# Patient Record
Sex: Male | Born: 1955
Health system: Southern US, Community
[De-identification: ages and names within clinical notes are randomized; demographics above are authoritative.]

## PROBLEM LIST (undated history)

## (undated) DIAGNOSIS — A4902 Methicillin resistant Staphylococcus aureus infection, unspecified site: Secondary | ICD-10-CM

## (undated) DIAGNOSIS — J449 Chronic obstructive pulmonary disease, unspecified: Secondary | ICD-10-CM

## (undated) DIAGNOSIS — R7303 Prediabetes: Secondary | ICD-10-CM

## (undated) DIAGNOSIS — K219 Gastro-esophageal reflux disease without esophagitis: Secondary | ICD-10-CM

## (undated) DIAGNOSIS — M792 Neuralgia and neuritis, unspecified: Secondary | ICD-10-CM

## (undated) DIAGNOSIS — F329 Major depressive disorder, single episode, unspecified: Secondary | ICD-10-CM

## (undated) DIAGNOSIS — G8929 Other chronic pain: Secondary | ICD-10-CM

## (undated) DIAGNOSIS — M1990 Unspecified osteoarthritis, unspecified site: Secondary | ICD-10-CM

## (undated) DIAGNOSIS — M549 Dorsalgia, unspecified: Secondary | ICD-10-CM

## (undated) DIAGNOSIS — M1711 Unilateral primary osteoarthritis, right knee: Secondary | ICD-10-CM

## (undated) DIAGNOSIS — F32A Depression, unspecified: Secondary | ICD-10-CM

## (undated) DIAGNOSIS — I10 Essential (primary) hypertension: Secondary | ICD-10-CM

## (undated) HISTORY — PX: NOSE SURGERY: SHX723

## (undated) HISTORY — PX: ELBOW SURGERY: SHX618

---

## 2003-08-20 ENCOUNTER — Encounter: Admission: RE | Admit: 2003-08-20 | Discharge: 2003-08-20 | Payer: Self-pay | Admitting: Infectious Diseases

## 2003-09-16 ENCOUNTER — Encounter: Admission: RE | Admit: 2003-09-16 | Discharge: 2003-09-16 | Payer: Self-pay | Admitting: Infectious Diseases

## 2007-06-19 ENCOUNTER — Ambulatory Visit (HOSPITAL_COMMUNITY): Admission: RE | Admit: 2007-06-19 | Discharge: 2007-06-19 | Payer: Self-pay | Admitting: Anesthesiology

## 2007-08-16 ENCOUNTER — Emergency Department (HOSPITAL_COMMUNITY): Admission: EM | Admit: 2007-08-16 | Discharge: 2007-08-16 | Payer: Self-pay | Admitting: Emergency Medicine

## 2009-04-24 ENCOUNTER — Ambulatory Visit: Payer: Self-pay | Admitting: Cardiology

## 2010-03-31 ENCOUNTER — Emergency Department (HOSPITAL_COMMUNITY)
Admission: EM | Admit: 2010-03-31 | Discharge: 2010-03-31 | Payer: Self-pay | Source: Home / Self Care | Admitting: Emergency Medicine

## 2010-06-19 ENCOUNTER — Inpatient Hospital Stay (HOSPITAL_COMMUNITY)
Admission: EM | Admit: 2010-06-19 | Discharge: 2010-06-22 | DRG: 896 | Disposition: A | Discharge status: Home / Self Care | Payer: Medicaid Other | Attending: Emergency Medicine | Admitting: Emergency Medicine

## 2010-06-19 ENCOUNTER — Emergency Department (HOSPITAL_COMMUNITY): Payer: Medicaid Other

## 2010-06-19 DIAGNOSIS — E860 Dehydration: Secondary | ICD-10-CM | POA: Diagnosis present

## 2010-06-19 DIAGNOSIS — F10931 Alcohol use, unspecified with withdrawal delirium: Principal | ICD-10-CM | POA: Diagnosis present

## 2010-06-19 DIAGNOSIS — F1020 Alcohol dependence, uncomplicated: Secondary | ICD-10-CM | POA: Diagnosis present

## 2010-06-19 DIAGNOSIS — F10231 Alcohol dependence with withdrawal delirium: Principal | ICD-10-CM | POA: Diagnosis present

## 2010-06-19 DIAGNOSIS — M549 Dorsalgia, unspecified: Secondary | ICD-10-CM | POA: Diagnosis present

## 2010-06-19 DIAGNOSIS — J189 Pneumonia, unspecified organism: Secondary | ICD-10-CM | POA: Diagnosis present

## 2010-06-19 DIAGNOSIS — G8929 Other chronic pain: Secondary | ICD-10-CM | POA: Diagnosis present

## 2010-06-19 DIAGNOSIS — F341 Dysthymic disorder: Secondary | ICD-10-CM | POA: Diagnosis present

## 2010-06-19 LAB — BASIC METABOLIC PANEL
BUN: 20 mg/dL (ref 6–23)
CO2: 24 mEq/L (ref 19–32)
Chloride: 100 mEq/L (ref 96–112)
Creatinine, Ser: 0.92 mg/dL (ref 0.4–1.5)
GFR calc Af Amer: >60 mL/min (ref >60)
Potassium: 3.7 mEq/L (ref 3.5–5.1)

## 2010-06-19 LAB — DIFFERENTIAL
Lymphs Abs: 1.4 10*3/uL (ref 0.7–4.0)
Monocytes Relative: 9 % (ref 3–12)
Neutro Abs: 7.4 10*3/uL (ref 1.7–7.7)
Neutrophils Relative %: 76 % (ref 43–77)

## 2010-06-19 LAB — CBC
Hemoglobin: 13.7 g/dL (ref 13.0–17.0)
MCH: 33.2 pg (ref 26.0–34.0)
MCV: 100.7 fL — ABNORMAL HIGH (ref 78.0–100.0)
RBC: 4.13 MIL/uL — ABNORMAL LOW (ref 4.22–5.81)
WBC: 9.8 10*3/uL (ref 4.0–10.5)

## 2010-06-20 ENCOUNTER — Emergency Department (HOSPITAL_COMMUNITY): Payer: Medicaid Other

## 2010-06-20 LAB — HEPATIC FUNCTION PANEL
ALT: 34 U/L (ref 0–53)
AST: 28 U/L (ref 0–37)
Albumin: 3.2 g/dL — ABNORMAL LOW (ref 3.5–5.2)
Alkaline Phosphatase: 44 U/L (ref 39–117)
Bilirubin, Direct: 0.1 mg/dL (ref 0.0–0.3)
Indirect Bilirubin: 0.6 mg/dL (ref 0.3–0.9)
Total Bilirubin: 0.7 mg/dL (ref 0.3–1.2)
Total Protein: 6.1 g/dL (ref 6.0–8.3)

## 2010-06-20 LAB — AMMONIA: Ammonia: 33 umol/L (ref 11–35)

## 2010-06-20 LAB — URINALYSIS, ROUTINE W REFLEX MICROSCOPIC
Glucose, UA: NEGATIVE mg/dL
Hgb urine dipstick: NEGATIVE
Nitrite: NEGATIVE
Protein, ur: NEGATIVE mg/dL
Specific Gravity, Urine: >1.03 — ABNORMAL HIGH (ref 1.005–1.030)
Urobilinogen, UA: 0.2 mg/dL (ref 0.0–1.0)
pH: 5.5 (ref 5.0–8.0)

## 2010-06-20 LAB — RAPID URINE DRUG SCREEN, HOSP PERFORMED
Barbiturates: NOT DETECTED
Opiates: POSITIVE — AB

## 2010-06-20 LAB — TSH: TSH: 1.018 u[IU]/mL (ref 0.350–4.500)

## 2010-06-20 LAB — APTT: aPTT: 35 seconds (ref 24–37)

## 2010-06-20 LAB — ETHANOL: Alcohol, Ethyl (B): <5 mg/dL (ref 0–10)

## 2010-06-20 LAB — MAGNESIUM: Magnesium: 2 mg/dL (ref 1.5–2.5)

## 2010-06-20 LAB — VITAMIN B12: Vitamin B-12: 275 pg/mL (ref 211–911)

## 2010-06-20 LAB — PROTIME-INR: INR: 1.11 (ref 0.00–1.49)

## 2010-06-21 LAB — COMPREHENSIVE METABOLIC PANEL
ALT: 34 U/L (ref 0–53)
AST: 28 U/L (ref 0–37)
Alkaline Phosphatase: 60 U/L (ref 39–117)
CO2: 24 mEq/L (ref 19–32)
Calcium: 9.1 mg/dL (ref 8.4–10.5)
GFR calc Af Amer: >60 mL/min (ref >60)
GFR calc non Af Amer: >60 mL/min (ref >60)
Glucose, Bld: 116 mg/dL — ABNORMAL HIGH (ref 70–99)
Potassium: 3.7 mEq/L (ref 3.5–5.1)
Sodium: 139 mEq/L (ref 135–145)
Total Protein: 6.9 g/dL (ref 6.0–8.3)

## 2010-06-21 LAB — CBC
HCT: 41.5 % (ref 39.0–52.0)
Hemoglobin: 13.6 g/dL (ref 13.0–17.0)
MCHC: 32.8 g/dL (ref 30.0–36.0)
WBC: 7.8 10*3/uL (ref 4.0–10.5)

## 2010-06-21 LAB — DIFFERENTIAL
Basophils Absolute: 0.1 10*3/uL (ref 0.0–0.1)
Basophils Relative: 1 % (ref 0–1)
Lymphocytes Relative: 18 % (ref 12–46)
Neutro Abs: 5 10*3/uL (ref 1.7–7.7)
Neutrophils Relative %: 64 % (ref 43–77)

## 2010-06-21 LAB — URINE CULTURE
Colony Count: NO GROWTH
Culture  Setup Time: 201204141945

## 2010-06-21 LAB — RPR: RPR Ser Ql: NONREACTIVE

## 2010-06-22 LAB — COMPREHENSIVE METABOLIC PANEL
AST: 27 U/L (ref 0–37)
Albumin: 3.1 g/dL — ABNORMAL LOW (ref 3.5–5.2)
Calcium: 8.5 mg/dL (ref 8.4–10.5)
Creatinine, Ser: 0.73 mg/dL (ref 0.4–1.5)
GFR calc Af Amer: >60 mL/min (ref >60)
Total Protein: 6.7 g/dL (ref 6.0–8.3)

## 2010-06-22 LAB — DIFFERENTIAL
Basophils Relative: 1 % (ref 0–1)
Eosinophils Absolute: 0.4 10*3/uL (ref 0.0–0.7)
Monocytes Absolute: 1.6 10*3/uL — ABNORMAL HIGH (ref 0.1–1.0)
Monocytes Relative: 15 % — ABNORMAL HIGH (ref 3–12)
Neutrophils Relative %: 62 % (ref 43–77)

## 2010-06-22 LAB — CBC
MCH: 32.6 pg (ref 26.0–34.0)
MCHC: 33.3 g/dL (ref 30.0–36.0)
Platelets: 221 10*3/uL (ref 150–400)
RBC: 4.2 MIL/uL — ABNORMAL LOW (ref 4.22–5.81)

## 2010-06-23 LAB — MRSA CULTURE

## 2010-07-09 NOTE — Discharge Summary (Signed)
NAME:  Joseph Ellison, Joseph Ellison           ACCOUNT NO.:  [id number]  MEDICAL RECORD NO.:  [id number]           PATIENT TYPE:  I  LOCATION:  A228                          FACILITY:  [facility]  PHYSICIAN:  Welford Christmas, DO         DATE OF BIRTH:  04-04-1955  DATE OF ADMISSION:  06/19/2010 DATE OF DISCHARGE:  04/16/2012LH                              DISCHARGE SUMMARY   ADMISSION DIAGNOSES:  Acute delirium, pneumonia, alcohol withdrawal, dehydration, chronic back pain, history of depression and gastroesophageal reflux disease.  HISTORY OF PRESENT ILLNESS:  Please see H and P.  HOSPITAL COURSE:  The patient was admitted.  He was placed on the detox protocol.  On the morning after admission, the patient was in acute DT. He was very difficult to control.  He required high doses of Ativan and Haldol.  His delirium was managed.  He was continued on IV antibiotics and given IV fluids.  Over the past 2 days, his sensorium has cleared. He has required less and less benzodiazepine to cover his withdrawal. At this point, he required 1 mg of Ativan IV last night, and he has taken 1 mg p.o. this morning.  He is calm, he is awake, alert and oriented x3.  The patient states that he wants to stop drinking.  I have discussed with him withdrawal and that the medications that he can use to stay withdrawal and that it is important not to take these medications if he does resume drinking.  In terms of his pneumonia, his breath sounds are clear.  He is satting well on room air and he is afebrile.  I will check back with the nurse this afternoon if the patient continues to have low requirements of benzodiazepine coverage, he will be discharged to home in good condition.  DISCHARGE DIAGNOSES:  Delirium tremens, community-acquired pneumonia, dehydration, chronic back pain, alcohol abuse, depression and anxiety.  DISCHARGE INSTRUCTIONS:  Activity as tolerated.  The patient is to stop drinking.  The patient has been  provided with resources for outpatient alcohol rehab.  He should follow up with these resources and get help as he states that he intends to stop drinking.  He is to follow up with Dr. Loney Hering in [place] in 3-4 weeks, and he is to follow up with his pain specialist in [place], keep his next appointment or see him p.r.n.  DIET:  Regular.  ACTIVITIES:  As tolerated.  MEDICATIONS:  Acetaminophen 650 mg p.o. q.4 h as needed for pain, Flora- Q one capsule by mouth daily for 10 days, folic acid 1 mg by mouth daily, Levaquin 750 mg one p.o. daily x4 days, multivitamin one p.o. daily, nicotine patch transdermal daily, thiamine 100 mg one p.o. daily, Xanax 1 mg one p.o. three times daily.  I have discussed this medication with the patient and told him that it may be used to keep him out of withdrawal and he is to take it as prescribed and as he is already this in rather significant doses of benzodiazepine, I will not be a prescribing a second look for out of concern for overdosing him.  He also  understands that if he does drink, he is not to take this medication:  Amlodipine 10 mg one p.o. daily, diphenhydramine 25 mg one p.o. q.12 h, Flonase 2 sprays as needed, gabapentin 600 mg one p.o. t.i.d., hydroxyzine 25 mg one p.o. q.i.d., omeprazole 20 mg one p.o. b.i.d., oxycodone 15 mg one p.o. by mouth every 4 hours as needed for pain.  I spent 40 minutes on this discharge.                                           ______________________________ Fran Lowes, DO     AS/MEDQ  D:  06/22/2010  T:  06/23/2010  Job:  [id number]  cc:   Dr. Lynnea Maizes  Electronically Signed by Fran Lowes DO on 07/09/2010 06:07:02 PM

## 2010-07-19 NOTE — H&P (Signed)
NAME:  NIALL, ILLES NO.:  [id number]  MEDICAL RECORD NO.:  [id number]           PATIENT TYPE:  LOCATION:                                 FACILITY:  PHYSICIAN:  Osvaldo Shipper, MD     DATE OF BIRTH:  Jan 01, 1956  DATE OF ADMISSION:  06/20/2010 DATE OF DISCHARGE:  LH                             HISTORY & PHYSICAL   PRIMARY CARE PHYSICIAN:  Dr. Loney Hering in [place], [place].  He is also followed by [facility] pain specialist in [place].  ADMISSION DIAGNOSES: 1. Acute delirium/encephalopathy/confusion, etiology unclear. 2. Pneumonia, community-acquired. 3. History of alcoholism, possible withdrawal. 4. Dehydration. 5. Chronic back pain. 6. History of depression. 7. Gastroesophageal reflux disease.  CHIEF COMPLAINT:  Confusion.  HISTORY OF PRESENT ILLNESS:  The patient is a 55 year old Caucasian male who was brought into the hospital by his wife because of confusion. Apparently, he was in his usual state of health till last night when he fell asleep, that is Thursday night.  He did not wake up until Friday evening at 8:00 p.m. and then when he got up, he was very confused, did not recognize his wife.  He was talking about weird and bizarre things. He was also having some raspy breathing per his wife and then so she decided to bring him to the hospital.  No history of fever, chills, is unclear if he has had any nausea, vomiting.  No history of any falls. Denies any headache.  He does have chronic back pain, which is 10/10 in intensity.  No history of any sick contacts.  However, he does drink 6 beers every evening and over the last 3 days, he has not consumed any for reasons that are not clear.  Because of his confusion, history is very poor.  MEDICATIONS AT HOME: 1. Xanax 1 mg p.o. t.i.d. 2. Gabapentin, unknown dose. 3. Hydroxyzine, unknown dose. 4. Omeprazole, unknown dose. 5. Amlodipine, unknown dose. 6. Oxycodone and acetaminophen, unknown dose  every 4 hours as needed     for pain.  ALLERGIES:  PENICILLIN.  SURGICAL HISTORY:  Elbow surgery and knee surgery and apparently is waiting to get back surgery.  PAST MEDICAL HISTORY:  Positive for anxiety disorder, asthma, chronic back pain, depression, GERD, hypertension.  SOCIAL HISTORY:  Lives in [place] with his wife, smokes one to one and half pack of cigarettes on a daily basis.  Alcohol consumption as above. Denies any illicit drug use.  Independent with his daily activity.  FAMILY HISTORY:  Positive for diabetes.  REVIEW OF SYSTEMS:  Unable to do because of confusion.  PHYSICAL EXAMINATION:  VITAL SIGNS:  Temperature 100.0 rectally, blood pressure 105/72, heart rate 99, respiratory rate 18, saturation 94% on room air. GENERAL:  Well-developed, well-nourished white male in no distress, very confused. HEENT:  Head is normocephalic, atraumatic.  Pupils are equal and reacting.  No pallor, no icterus.  Oral mucous membranes are slightly dry.  No oral lesions noted. NECK:  Soft and supple.  No thyromegaly is appreciated. LUNGS:  Few crackles at the right base but mostly clear to auscultation. No wheezing is present. CARDIOVASCULAR:  S1, S2  is normal, regular.  No S3, S4, rubs, murmurs, or bruits. ABDOMEN:  Soft, nontender, nondistended.  Bowel sounds are present.  No masses or organomegaly is appreciated. GU:  Deferred. MUSCULOSKELETAL:  He has got normal muscle mass and tone.  He was unable to lift both his legs off the bed completely because of the chronic back issues.  Otherwise, he does not have any focal neurological exam deficits.  He is confused.  He is alert, was disoriented to the date, he got the year right, he did know the month.  He knew he was in [place].  IMAGING STUDIES:  A chest x-ray which showed possible infiltrate in the right base.  LAB DATA:  His CBC was unremarkable except for MCV of 100.7.  His electrolytes were normal.  Glucose was 112.  LFTs  were normal.  UA shows specific gravity greater than 1.030, moderate bilirubin, trace ketones, otherwise negative.  The patient had EKG done, which shows sinus rhythm with normal axis intervals, it appear to be in the normal range, rate was 97, incomplete right bundle branch block.  No Q-waves.  No concerning ST or T-wave changes were noted.  ASSESSMENT:  This is a 55 year old Caucasian male with a past medical history as stated earlier who presents with acute confusion, is found to have a pneumonia.  The reason for his confusion could be alcohol withdrawal.  The wife denies any new medications, denies excessive use of any of his home medications; however, that does remain a possibility. Intracranial abnormality also need to be ruled out.  PLAN: 1. Acute encephalopathy.  We will start with a CT head, check a urine     drug screen, give him thiamine, put him on an Ativan protocol.     Ammonia level will be checked as well.  TSH, B12, and RPR will also     be checked. 2. Community-acquired pneumonia will be cleared with Levaquin. 3. Alcohol abuse issues as above. 4. History of depression, anxiety.  Continue with Xanax at a lower     dose to avoid withdrawal. 5. History of chronic back pain.  Continue with as-needed pain     medicines.  I asked the wife to bring in all of his medications in the morning, so that we know what to prescribe him while he is here.  A sitter will be utilized for safety.  Further management decisions will depend on results of further testing and patient's response to treatment.  He is a full code.  DVT prophylaxis will be provided.     Osvaldo Shipper, MD     GK/MEDQ  D:  06/20/2010  T:  06/20/2010  Job:  [id number]  cc:   Dr. Loney Hering  Electronically Signed by Osvaldo Shipper MD on 07/19/2010 10:32:11 PM

## 2010-08-29 ENCOUNTER — Emergency Department (HOSPITAL_COMMUNITY)
Admission: EM | Admit: 2010-08-29 | Discharge: 2010-08-29 | Disposition: A | Discharge status: Home / Self Care | Payer: Medicaid Other | Attending: Emergency Medicine | Admitting: Emergency Medicine

## 2010-08-29 ENCOUNTER — Emergency Department (HOSPITAL_COMMUNITY): Payer: Medicaid Other

## 2010-08-29 DIAGNOSIS — R0989 Other specified symptoms and signs involving the circulatory and respiratory systems: Secondary | ICD-10-CM | POA: Insufficient documentation

## 2010-08-29 DIAGNOSIS — R0602 Shortness of breath: Secondary | ICD-10-CM | POA: Insufficient documentation

## 2010-08-29 DIAGNOSIS — J3489 Other specified disorders of nose and nasal sinuses: Secondary | ICD-10-CM | POA: Insufficient documentation

## 2010-08-29 DIAGNOSIS — R059 Cough, unspecified: Secondary | ICD-10-CM | POA: Insufficient documentation

## 2010-08-29 DIAGNOSIS — R05 Cough: Secondary | ICD-10-CM | POA: Insufficient documentation

## 2010-08-29 DIAGNOSIS — F17200 Nicotine dependence, unspecified, uncomplicated: Secondary | ICD-10-CM | POA: Insufficient documentation

## 2010-08-29 DIAGNOSIS — R6883 Chills (without fever): Secondary | ICD-10-CM | POA: Insufficient documentation

## 2010-08-29 DIAGNOSIS — J40 Bronchitis, not specified as acute or chronic: Secondary | ICD-10-CM | POA: Insufficient documentation

## 2010-08-29 DIAGNOSIS — R0789 Other chest pain: Secondary | ICD-10-CM | POA: Insufficient documentation

## 2010-08-29 DIAGNOSIS — R062 Wheezing: Secondary | ICD-10-CM | POA: Insufficient documentation

## 2010-08-29 DIAGNOSIS — R0609 Other forms of dyspnea: Secondary | ICD-10-CM | POA: Insufficient documentation

## 2010-12-03 LAB — BASIC METABOLIC PANEL
BUN: 12
Chloride: 101
Glucose, Bld: 164 — ABNORMAL HIGH
Potassium: 4.1

## 2010-12-03 LAB — POCT CARDIAC MARKERS
CKMB, poc: <1 — ABNORMAL LOW
Operator id: 217151
Operator id: 218581
Troponin i, poc: <0.05

## 2010-12-03 LAB — DIFFERENTIAL
Eosinophils Absolute: 0.2
Eosinophils Relative: 3
Lymphs Abs: 2
Monocytes Absolute: 0.6

## 2010-12-03 LAB — CBC
HCT: 45.3
MCV: 95.8
Platelets: 255
RDW: 13.4
WBC: 8.7

## 2013-07-21 ENCOUNTER — Other Ambulatory Visit: Payer: Self-pay

## 2013-07-21 ENCOUNTER — Emergency Department (HOSPITAL_COMMUNITY): Payer: Medicare Other

## 2013-07-21 ENCOUNTER — Emergency Department (HOSPITAL_COMMUNITY)
Admission: EM | Admit: 2013-07-21 | Discharge: 2013-07-21 | Disposition: A | Discharge status: Home / Self Care | Payer: Medicare Other | Attending: Emergency Medicine | Admitting: Emergency Medicine

## 2013-07-21 ENCOUNTER — Encounter (HOSPITAL_COMMUNITY): Payer: Self-pay | Admitting: Emergency Medicine

## 2013-07-21 DIAGNOSIS — Z8719 Personal history of other diseases of the digestive system: Secondary | ICD-10-CM | POA: Insufficient documentation

## 2013-07-21 DIAGNOSIS — M79673 Pain in unspecified foot: Secondary | ICD-10-CM

## 2013-07-21 DIAGNOSIS — R42 Dizziness and giddiness: Secondary | ICD-10-CM | POA: Insufficient documentation

## 2013-07-21 DIAGNOSIS — S99919A Unspecified injury of unspecified ankle, initial encounter: Secondary | ICD-10-CM | POA: Diagnosis present

## 2013-07-21 DIAGNOSIS — I10 Essential (primary) hypertension: Secondary | ICD-10-CM | POA: Insufficient documentation

## 2013-07-21 DIAGNOSIS — G8929 Other chronic pain: Secondary | ICD-10-CM | POA: Insufficient documentation

## 2013-07-21 DIAGNOSIS — Y929 Unspecified place or not applicable: Secondary | ICD-10-CM | POA: Diagnosis not present

## 2013-07-21 DIAGNOSIS — Z8669 Personal history of other diseases of the nervous system and sense organs: Secondary | ICD-10-CM | POA: Diagnosis not present

## 2013-07-21 DIAGNOSIS — W19XXXA Unspecified fall, initial encounter: Secondary | ICD-10-CM

## 2013-07-21 DIAGNOSIS — IMO0002 Reserved for concepts with insufficient information to code with codable children: Secondary | ICD-10-CM | POA: Diagnosis not present

## 2013-07-21 DIAGNOSIS — Z8659 Personal history of other mental and behavioral disorders: Secondary | ICD-10-CM | POA: Insufficient documentation

## 2013-07-21 DIAGNOSIS — S8990XA Unspecified injury of unspecified lower leg, initial encounter: Secondary | ICD-10-CM | POA: Insufficient documentation

## 2013-07-21 DIAGNOSIS — Y9389 Activity, other specified: Secondary | ICD-10-CM | POA: Insufficient documentation

## 2013-07-21 DIAGNOSIS — S99929A Unspecified injury of unspecified foot, initial encounter: Principal | ICD-10-CM

## 2013-07-21 DIAGNOSIS — Z8619 Personal history of other infectious and parasitic diseases: Secondary | ICD-10-CM | POA: Insufficient documentation

## 2013-07-21 DIAGNOSIS — S298XXA Other specified injuries of thorax, initial encounter: Secondary | ICD-10-CM | POA: Diagnosis not present

## 2013-07-21 DIAGNOSIS — F17200 Nicotine dependence, unspecified, uncomplicated: Secondary | ICD-10-CM | POA: Insufficient documentation

## 2013-07-21 DIAGNOSIS — J449 Chronic obstructive pulmonary disease, unspecified: Secondary | ICD-10-CM | POA: Diagnosis not present

## 2013-07-21 DIAGNOSIS — J4489 Other specified chronic obstructive pulmonary disease: Secondary | ICD-10-CM | POA: Insufficient documentation

## 2013-07-21 DIAGNOSIS — R0789 Other chest pain: Secondary | ICD-10-CM

## 2013-07-21 DIAGNOSIS — W1809XA Striking against other object with subsequent fall, initial encounter: Secondary | ICD-10-CM | POA: Insufficient documentation

## 2013-07-21 DIAGNOSIS — Z880 Allergy status to penicillin: Secondary | ICD-10-CM | POA: Diagnosis not present

## 2013-07-21 HISTORY — DX: Other chronic pain: G89.29

## 2013-07-21 HISTORY — DX: Gastro-esophageal reflux disease without esophagitis: K21.9

## 2013-07-21 HISTORY — DX: Methicillin resistant Staphylococcus aureus infection, unspecified site: A49.02

## 2013-07-21 HISTORY — DX: Dorsalgia, unspecified: M54.9

## 2013-07-21 HISTORY — DX: Chronic obstructive pulmonary disease, unspecified: J44.9

## 2013-07-21 HISTORY — DX: Major depressive disorder, single episode, unspecified: F32.9

## 2013-07-21 HISTORY — DX: Essential (primary) hypertension: I10

## 2013-07-21 HISTORY — DX: Neuralgia and neuritis, unspecified: M79.2

## 2013-07-21 HISTORY — DX: Depression, unspecified: F32.A

## 2013-07-21 LAB — COMPREHENSIVE METABOLIC PANEL
ALT: 8 U/L (ref 0–53)
AST: 13 U/L (ref 0–37)
Albumin: 3.3 g/dL — ABNORMAL LOW (ref 3.5–5.2)
Alkaline Phosphatase: 63 U/L (ref 39–117)
BUN: 11 mg/dL (ref 6–23)
CALCIUM: 8.8 mg/dL (ref 8.4–10.5)
CO2: 29 mEq/L (ref 19–32)
CREATININE: 0.77 mg/dL (ref 0.50–1.35)
Chloride: 102 mEq/L (ref 96–112)
GFR calc non Af Amer: >90 mL/min (ref >90)
GLUCOSE: 90 mg/dL (ref 70–99)
Potassium: 4.4 mEq/L (ref 3.7–5.3)
Sodium: 139 mEq/L (ref 137–147)
TOTAL PROTEIN: 7 g/dL (ref 6.0–8.3)
Total Bilirubin: 0.3 mg/dL (ref 0.3–1.2)

## 2013-07-21 LAB — CBC WITH DIFFERENTIAL/PLATELET
Basophils Absolute: 0.1 10*3/uL (ref 0.0–0.1)
Basophils Relative: 1 % (ref 0–1)
EOS ABS: 0.2 10*3/uL (ref 0.0–0.7)
EOS PCT: 3 % (ref 0–5)
HCT: 42.5 % (ref 39.0–52.0)
HEMOGLOBIN: 14.1 g/dL (ref 13.0–17.0)
LYMPHS ABS: 2.4 10*3/uL (ref 0.7–4.0)
Lymphocytes Relative: 31 % (ref 12–46)
MCH: 32 pg (ref 26.0–34.0)
MCHC: 33.2 g/dL (ref 30.0–36.0)
MCV: 96.6 fL (ref 78.0–100.0)
MONOS PCT: 5 % (ref 3–12)
Monocytes Absolute: 0.4 10*3/uL (ref 0.1–1.0)
Neutro Abs: 4.6 10*3/uL (ref 1.7–7.7)
Neutrophils Relative %: 60 % (ref 43–77)
Platelets: 228 10*3/uL (ref 150–400)
RBC: 4.4 MIL/uL (ref 4.22–5.81)
RDW: 13.8 % (ref 11.5–15.5)
WBC: 7.7 10*3/uL (ref 4.0–10.5)

## 2013-07-21 LAB — URINALYSIS, ROUTINE W REFLEX MICROSCOPIC
BILIRUBIN URINE: NEGATIVE
Glucose, UA: NEGATIVE mg/dL
Hgb urine dipstick: NEGATIVE
Ketones, ur: NEGATIVE mg/dL
Leukocytes, UA: NEGATIVE
NITRITE: NEGATIVE
Protein, ur: NEGATIVE mg/dL
UROBILINOGEN UA: 0.2 mg/dL (ref 0.0–1.0)
pH: 5.5 (ref 5.0–8.0)

## 2013-07-21 LAB — D-DIMER, QUANTITATIVE (NOT AT ARMC): D DIMER QUANT: 0.34 ug{FEU}/mL (ref 0.00–0.48)

## 2013-07-21 LAB — TROPONIN I: Troponin I: <0.3 ng/mL (ref <0.30)

## 2013-07-21 MED ORDER — ONDANSETRON HCL 4 MG/2ML IJ SOLN
4.0000 mg | Freq: Once | INTRAMUSCULAR | Status: AC
  Administered 2013-07-21: 4 mg via INTRAVENOUS
  Filled 2013-07-21: qty 2

## 2013-07-21 MED ORDER — MORPHINE SULFATE 4 MG/ML IJ SOLN
4.0000 mg | Freq: Once | INTRAMUSCULAR | Status: AC
  Administered 2013-07-21: 4 mg via INTRAVENOUS
  Filled 2013-07-21: qty 1

## 2013-07-21 MED ORDER — SODIUM CHLORIDE 0.9 % IV BOLUS (SEPSIS)
1000.0000 mL | Freq: Once | INTRAVENOUS | Status: AC
  Administered 2013-07-21: 1000 mL via INTRAVENOUS

## 2013-07-21 MED ORDER — HYDROCODONE-ACETAMINOPHEN 5-325 MG PO TABS
2.0000 | ORAL_TABLET | Freq: Once | ORAL | Status: AC
  Administered 2013-07-21: 2 via ORAL
  Filled 2013-07-21: qty 2

## 2013-07-21 MED ORDER — OXYCODONE-ACETAMINOPHEN 5-325 MG PO TABS: 2.0000 | ORAL_TABLET | ORAL | Status: DC | PRN

## 2013-07-21 NOTE — ED Provider Notes (Signed)
CSN: [id number]     Arrival date & time 07/21/13  1442 History   First MD Initiated Contact with Patient 07/21/13 1458     Chief Complaint  Patient presents with  . Chest Pain  . Leg Pain     (Consider location/radiation/quality/duration/timing/severity/associated sxs/prior Treatment) HPI Comments: Patient reports intermittent right-sided chest pain right foot pain for the past 5 days ever since he had a fall. He states in the middle of the night he went to use the bathroom and urinated. When he turned around he felt lightheaded and dizzy and fell to the ground. He thinks he lost consciousness. He ended up on the ground on his back. He denies any chest pain preceding the fall. Since the fall, he's had pain in the top of his right foot and intermittent pain in the right side of his chest that is worse with palpation and movement. He denies any further episodes of dizziness but sometimes feels lightheaded when he goes from sitting to standing. Denies any cardiac history. He never had a heart attack. He endorses history of hypertension, depression COPD. He denies any suicidal thoughts or plans at this time. Denies any focal weakness, numbness or tingling.  The history is provided by the patient and the EMS personnel.    Past Medical History  Diagnosis Date  . COPD (chronic obstructive pulmonary disease)   . Back pain, chronic   . GERD (gastroesophageal reflux disease)   . Neurogenic pain, leg   . Hypertension   . Depression   . MRSA (methicillin resistant Staphylococcus aureus)    Past Surgical History  Procedure Laterality Date  . Nose surgery    . Elbow surgery     No family history on file. History  Substance Use Topics  . Smoking status: Current Every Day Smoker  . Smokeless tobacco: Not on file  . Alcohol Use: No    Review of Systems  Constitutional: Positive for activity change. Negative for fever.  HENT: Negative for congestion and rhinorrhea.   Respiratory: Positive for  chest tightness. Negative for cough and shortness of breath.   Cardiovascular: Positive for chest pain.  Gastrointestinal: Negative for nausea, vomiting and abdominal pain.  Genitourinary: Negative for dysuria and hematuria.  Musculoskeletal: Positive for arthralgias, back pain and myalgias. Negative for neck pain and neck stiffness.  Skin: Negative for rash.  Neurological: Positive for dizziness and light-headedness. Negative for weakness and headaches.  A complete 10 system review of systems was obtained and all systems are negative except as noted in the HPI and PMH.      Allergies  Penicillins  Home Medications   Prior to Admission medications   Not on File   BP 140/95  Pulse 48  Temp(Src) 98.1 F (36.7 C) (Oral)  Resp 14  Ht 5\' 9"  (1.753 m)  SpO2 94% Physical Exam  Constitutional: He is oriented to person, place, and time. He appears well-developed and well-nourished. No distress.  HENT:  Head: Normocephalic and atraumatic.  Mouth/Throat: Oropharynx is clear and moist. No oropharyngeal exudate.  Eyes: Conjunctivae and EOM are normal. Pupils are equal, round, and reactive to light.  Neck: Normal range of motion. Neck supple.  No C-spine tenderness  Cardiovascular: Normal rate, regular rhythm and normal heart sounds.   No murmur heard. Pulmonary/Chest: Effort normal and breath sounds normal. No respiratory distress. He exhibits tenderness.  Right-sided anterior chest pain reproducible to palpation  Abdominal: Soft. There is no tenderness. There is no rebound and no guarding.  Musculoskeletal: Normal range of motion. He exhibits tenderness. He exhibits no edema.  Tender to palpation right dorsal foot over first and second metatarsals. +2 DP and PT pulses  Neurological: He is alert and oriented to person, place, and time. No cranial nerve deficit. He exhibits normal muscle tone. Coordination normal.  CN 2-12 intact, no ataxia on finger to nose, no nystagmus, 5/5 strength  throughout, no pronator drift, Romberg negative, normal gait.   Skin: Skin is warm.    ED Course  Procedures (including critical care time) Labs Review Labs Reviewed  COMPREHENSIVE METABOLIC PANEL - Abnormal; Notable for the following:    Albumin 3.3 (*)    All other components within normal limits  URINALYSIS, ROUTINE W REFLEX MICROSCOPIC - Abnormal; Notable for the following:    Color, Urine AMBER (*)    Specific Gravity, Urine >1.030 (*)    All other components within normal limits  CBC WITH DIFFERENTIAL  TROPONIN I  TROPONIN I  D-DIMER, QUANTITATIVE    Imaging Review Dg Chest 2 View  07/21/2013   CLINICAL DATA:  Right dorsal foot pain and swelling. Back pain after fall 4 days ago  EXAM: CHEST  2 VIEW  COMPARISON:  08/29/2010  FINDINGS: The heart size and mediastinal contours are within normal limits. Both lungs are clear. The visualized skeletal structures are unremarkable.  IMPRESSION: No active cardiopulmonary disease.   Electronically Signed   By: Kerby Moors M.D.   On: 07/21/2013 16:21   Ct Head Wo Contrast  07/21/2013   CLINICAL DATA:  Dizziness, syncope  EXAM: CT HEAD WITHOUT CONTRAST  TECHNIQUE: Contiguous axial images were obtained from the base of the skull through the vertex without intravenous contrast.  COMPARISON:  CT HEAD W/O CM dated 06/20/2010  FINDINGS: No acute hemorrhage, infarct, or mass lesion is identified. Mild cortical volume loss noted with proportional ventricular prominence. Right maxillary sinus mucous retention cyst or polyp noted. Mild ethmoid mucoperiosteal thickening is noted. Orbits are unremarkable. No soft tissue abnormality. No skull fracture.  IMPRESSION: No acute intracranial findings.   Electronically Signed   By: Conchita Paris M.D.   On: 07/21/2013 16:28   Dg Foot Complete Right  07/21/2013   CLINICAL DATA:  Right dorsal foot pain/swelling.  Fall 4 days prior.  EXAM: RIGHT FOOT COMPLETE - 3+ VIEW  COMPARISON:  No comparisons  FINDINGS:  Normal anatomic alignment. No evidence for acute fracture dislocation. Regional soft tissues are unremarkable.  IMPRESSION: No acute fracture or dislocation.   Electronically Signed   By: Lovey Newcomer M.D.   On: 07/21/2013 16:23     EKG Interpretation None      MDM   Final diagnoses:  Fall  Atypical chest pain  Foot pain   Fall versus possible syncope with right foot pain as well as right-sided chest pain that is worse with palpation and movement. Patient endorses feeling dizzy after urinating and the next thing he knows he was on the ground.  EKG is normal sinus rhythm. X-rays are negative for any acute fractures. CT head is negative. Labs unremarkable.  Patient is [facility] syncope rules negative.  Troponin negative. Patient's chest pain is atypical for ACS. Is on the right side and reproducible. It is worse with movement and deep breathing. He states he's had this on and off for "a long time" meaning weeks to months. Reports having a negative stress test 3 or 4 years ago. His doctor told him he had inflammation around his lungs.  Troponin negative x2.  D-dimer negative. HEART score 2. Patient in no distress, watching TV. Low suspicion for ACS. Patient has had intermittent right-sided chest pain for several months. He states is the same. He is out of his chronic pain medications. He is tolerating by mouth in ED and he will ambulate. He will followup with his PCP this week. Return precautions discussed.   Date: 07/21/2013  Rate: 54  Rhythm: normal sinus rhythm  QRS Axis: normal  Intervals: normal  ST/T Wave abnormalities: normal  Conduction Disutrbances:none  Narrative Interpretation:   Old EKG Reviewed: unchanged   Filed Vitals:   07/21/13 1530 07/21/13 1700 07/21/13 1816 07/21/13 1900  BP: 136/89 158/91 159/87 140/95  Pulse: 50 56 50 48  Temp:      TempSrc:      Resp: 13 11 14    Height:      SpO2: 99% 96% 97% 94%    Ezequiel Essex, MD 07/22/13 0101

## 2013-07-21 NOTE — ED Notes (Signed)
Dr Rancour at bedside,  

## 2013-07-21 NOTE — ED Notes (Signed)
Pt will have periods where his heart rate will drop to 48-50, pt alert, able to answer questions during these episodes,

## 2013-07-21 NOTE — ED Notes (Signed)
[  pt arrived to er by [facility] for further evaluation of right side chest pain and right foot pain, pt states that he was in his bathroom a few days ago, turned, started getting dizzy, fell hitting his head against the door frame, commode, admits to LOC, unsure of hitting chest area or foot, arrived to er with c/o right side chest pain that has been intermittent since the fall, is better when he takes his medication for night time ( trazodone, percocet's, xanax,) pain is worse with palpation of right chest area, pt also has swelling, pain to right foot area, cms intact distal,

## 2013-07-21 NOTE — ED Notes (Signed)
[  pt returned from xray, states that his pain has gotten worse, Dr Wyvonnia Dusky notified,

## 2013-07-21 NOTE — ED Notes (Signed)
Pt now states that his chest pain is bilateral lower rib cage area and is worse with palpation of area,

## 2013-07-21 NOTE — ED Notes (Signed)
Pt on cardiac monitor upon arrival to er, NSR on monitor,

## 2013-07-21 NOTE — ED Notes (Signed)
Pt updated on plan of care, notified of additional blood work that was ordered, pt expressed understanding,

## 2013-07-21 NOTE — Discharge Instructions (Signed)
Chest Pain (Nonspecific) Take the pain medication as prescribed and follow up with your doctor. Return to the ED if you develop new or worsening symptoms. It is often hard to give a specific diagnosis for the cause of chest pain. There is always a chance that your pain could be related to something serious, such as a heart attack or a blood clot in the lungs. You need to follow up with your caregiver for further evaluation. CAUSES   Heartburn.  Pneumonia or bronchitis.  Anxiety or stress.  Inflammation around your heart (pericarditis) or lung (pleuritis or pleurisy).  A blood clot in the lung.  A collapsed lung (pneumothorax). It can develop suddenly on its own (spontaneous pneumothorax) or from injury (trauma) to the chest.  Shingles infection (herpes zoster virus). The chest wall is composed of bones, muscles, and cartilage. Any of these can be the source of the pain.  The bones can be bruised by injury.  The muscles or cartilage can be strained by coughing or overwork.  The cartilage can be affected by inflammation and become sore (costochondritis). DIAGNOSIS  Lab tests or other studies, such as X-rays, electrocardiography, stress testing, or cardiac imaging, may be needed to find the cause of your pain.  TREATMENT   Treatment depends on what may be causing your chest pain. Treatment may include:  Acid blockers for heartburn.  Anti-inflammatory medicine.  Pain medicine for inflammatory conditions.  Antibiotics if an infection is present.  You may be advised to change lifestyle habits. This includes stopping smoking and avoiding alcohol, caffeine, and chocolate.  You may be advised to keep your head raised (elevated) when sleeping. This reduces the chance of acid going backward from your stomach into your esophagus.  Most of the time, nonspecific chest pain will improve within 2 to 3 days with rest and mild pain medicine. HOME CARE INSTRUCTIONS   If antibiotics were  prescribed, take your antibiotics as directed. Finish them even if you start to feel better.  For the next few days, avoid physical activities that bring on chest pain. Continue physical activities as directed.  Do not smoke.  Avoid drinking alcohol.  Only take over-the-counter or prescription medicine for pain, discomfort, or fever as directed by your caregiver.  Follow your caregiver's suggestions for further testing if your chest pain does not go away.  Keep any follow-up appointments you made. If you do not go to an appointment, you could develop lasting (chronic) problems with pain. If there is any problem keeping an appointment, you must call to reschedule. SEEK MEDICAL CARE IF:   You think you are having problems from the medicine you are taking. Read your medicine instructions carefully.  Your chest pain does not go away, even after treatment.  You develop a rash with blisters on your chest. SEEK IMMEDIATE MEDICAL CARE IF:   You have increased chest pain or pain that spreads to your arm, neck, jaw, back, or abdomen.  You develop shortness of breath, an increasing cough, or you are coughing up blood.  You have severe back or abdominal pain, feel nauseous, or vomit.  You develop severe weakness, fainting, or chills.  You have a fever. THIS IS AN EMERGENCY. Do not wait to see if the pain will go away. Get medical help at once. Call your local emergency services (911 in [place] Do not drive yourself to the hospital. MAKE SURE YOU:   Understand these instructions.  Will watch your condition.  Will get help right away  if you are not doing well or get worse. Document Released: 12/02/2004 Document Revised: 05/17/2011 Document Reviewed: 09/28/2007 [facility] Patient Information 2014 [place].

## 2015-05-13 IMAGING — CR DG FOOT COMPLETE 3+V*R*
3 series · 3 of 3 positions shown · non-contrast
Comparison: No comparisons

CLINICAL DATA: Right dorsal foot pain/swelling.  Fall 4 days prior.

EXAM:
RIGHT FOOT COMPLETE - 3+ VIEW

[view not recorded (1 of 3)]
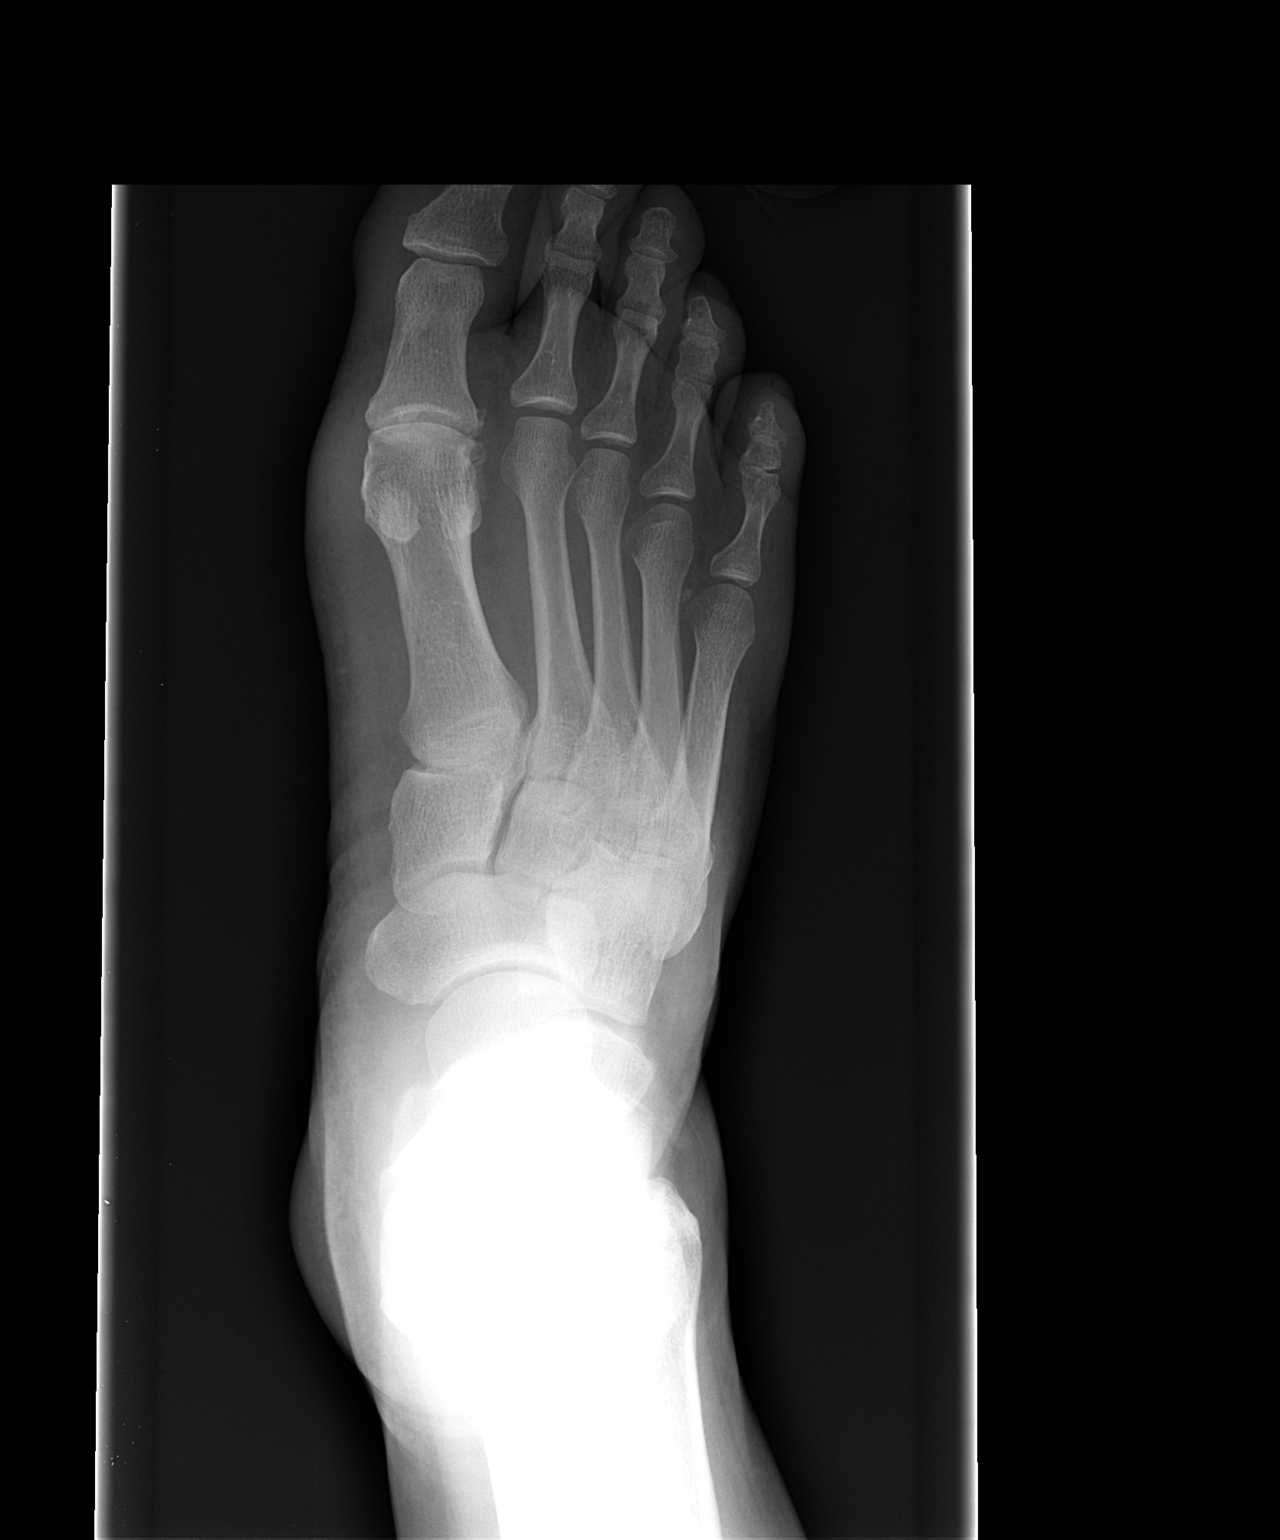

[view not recorded (2 of 3)]
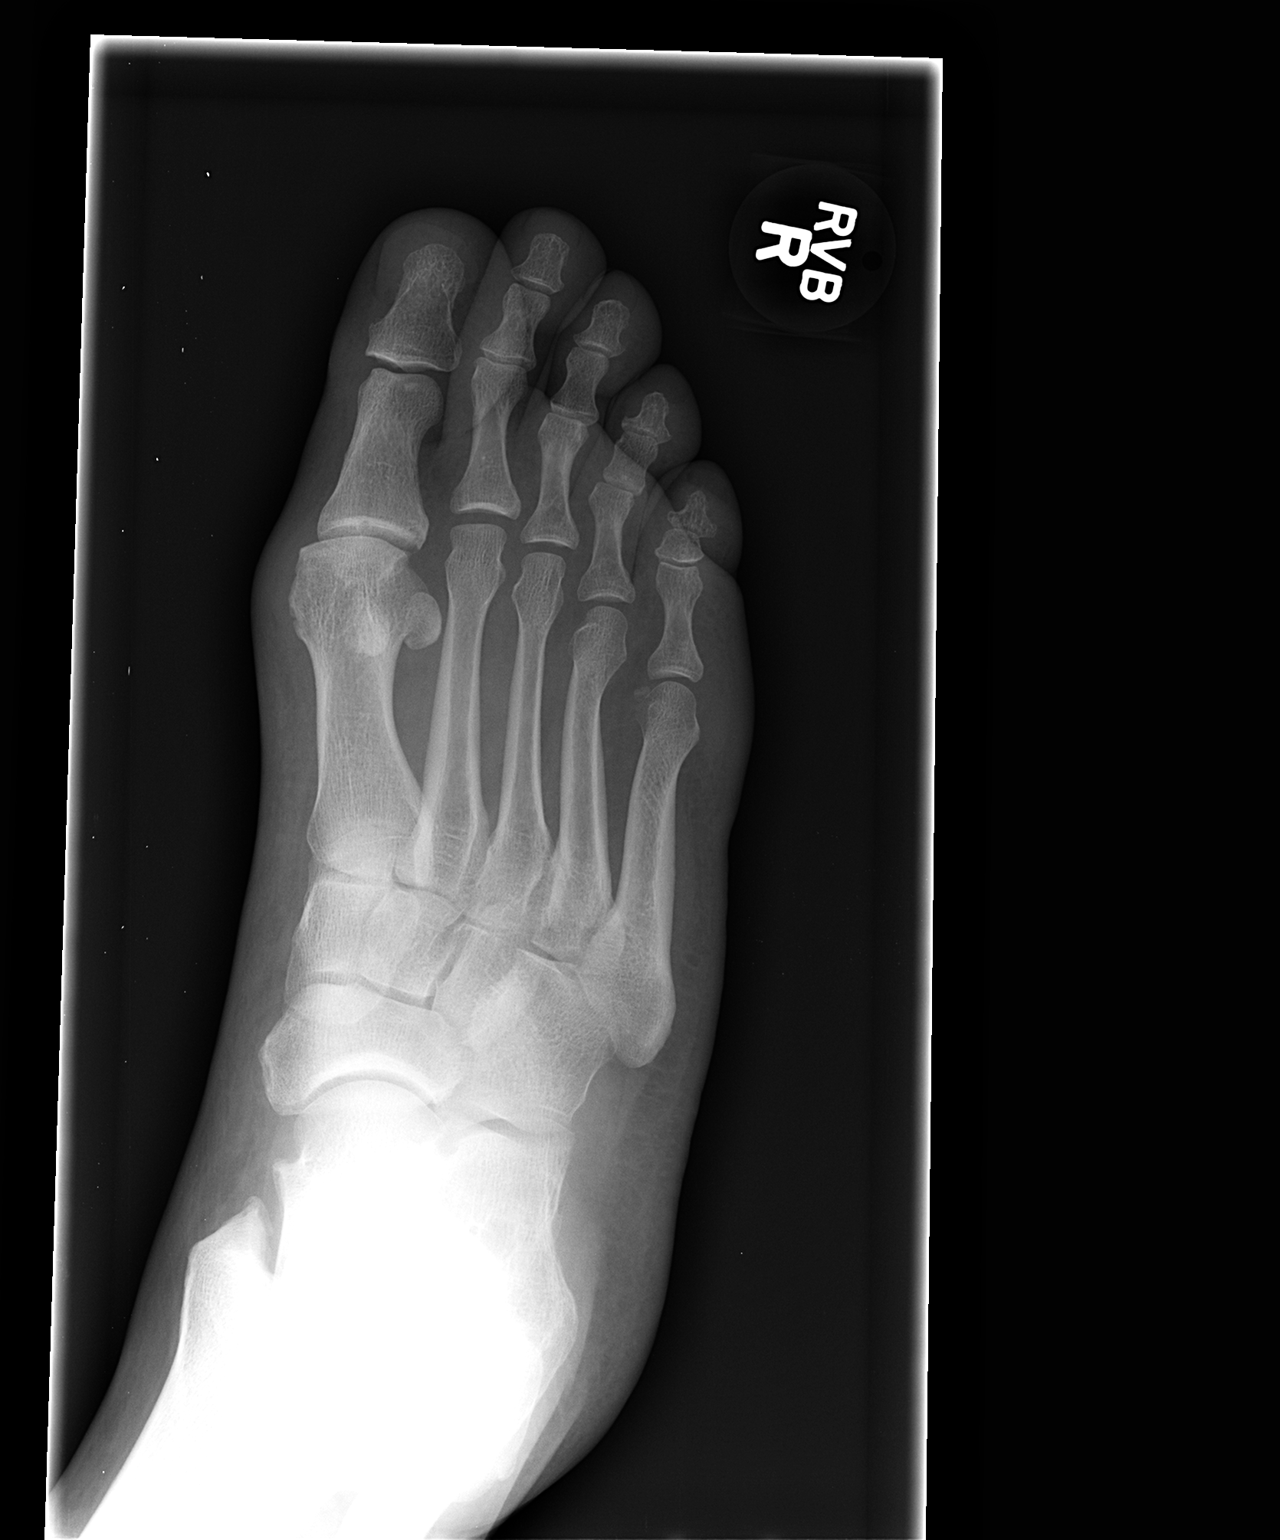

[view not recorded (3 of 3)]
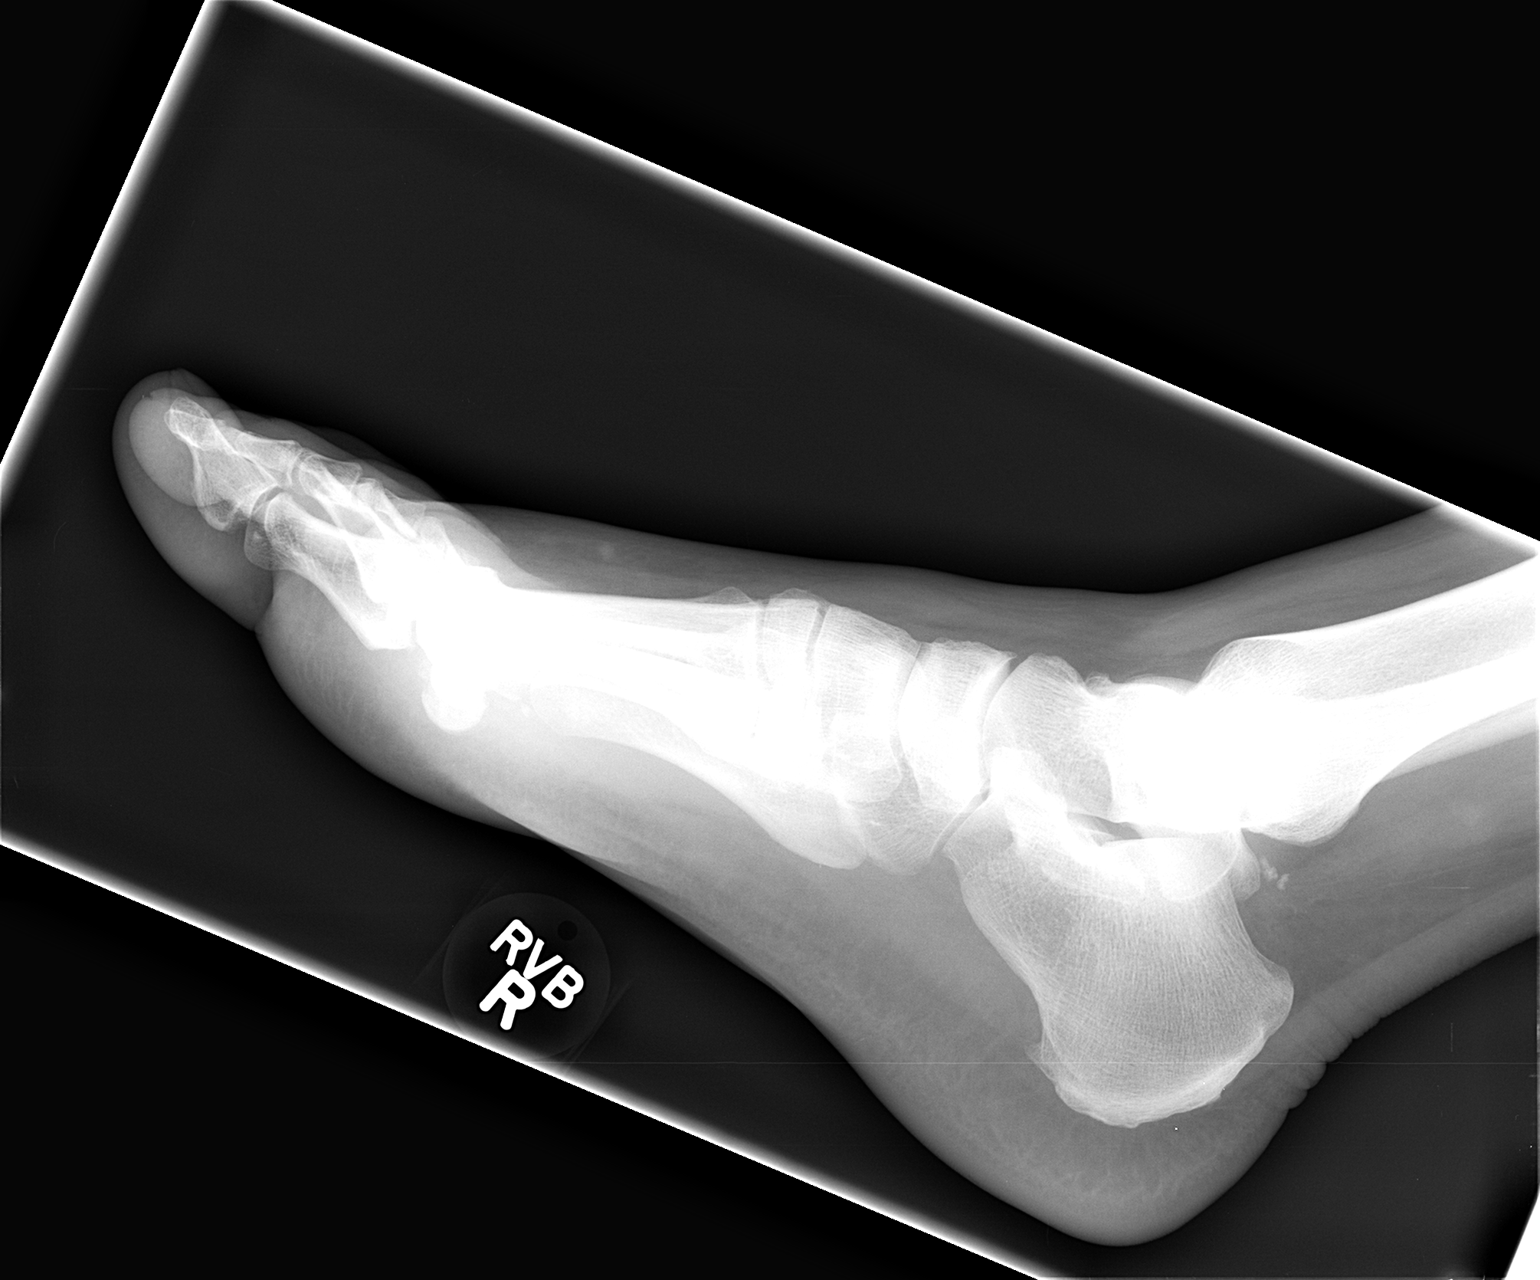

[3 of 3 positions shown; findings below may reference images not displayed]

FINDINGS: Normal anatomic alignment. No evidence for acute fracture
dislocation. Regional soft tissues are unremarkable.
IMPRESSION: No acute fracture or dislocation.

## 2015-05-13 IMAGING — CR DG CHEST 2V
2 series · 2 of 2 positions shown · non-contrast
Comparison: 08/29/2010

CLINICAL DATA: Right dorsal foot pain and swelling. Back pain after
fall 4 days ago

EXAM:
CHEST  2 VIEW

[view not recorded (1 of 2)]
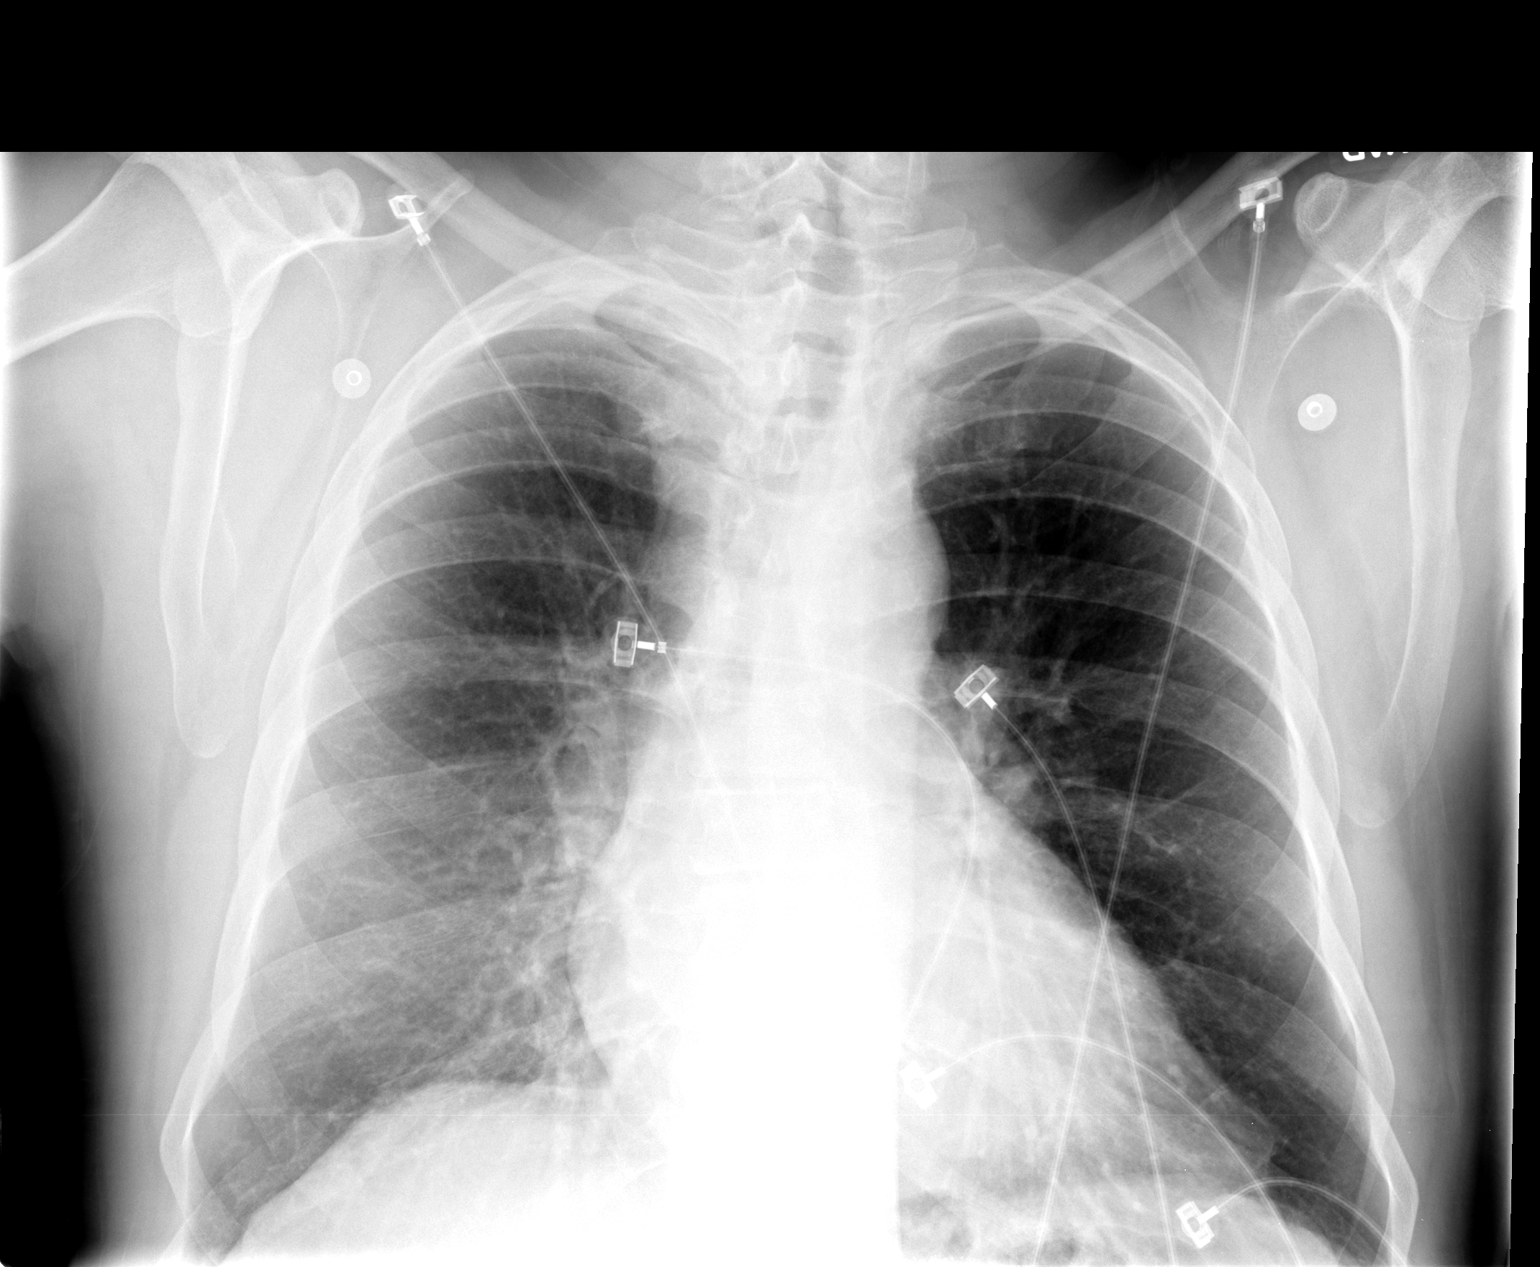

[view not recorded (2 of 2)]
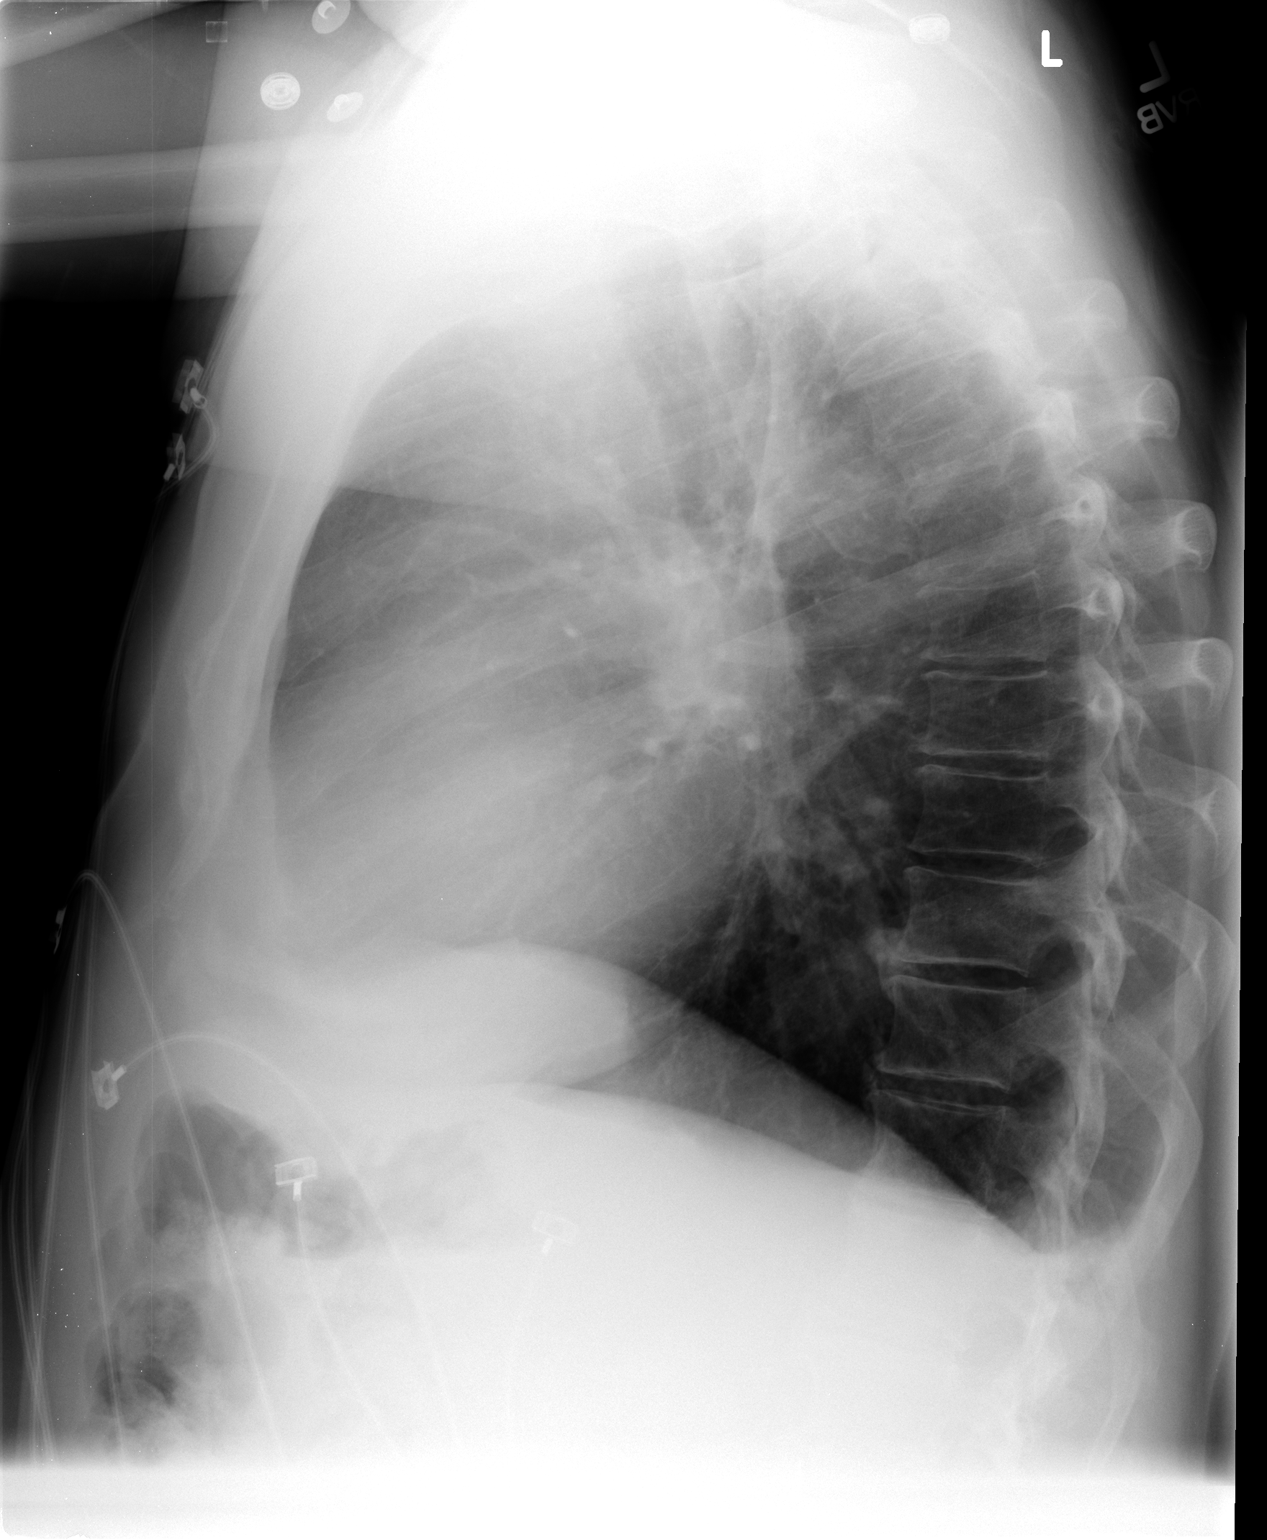

[2 of 2 positions shown; findings below may reference images not displayed]

FINDINGS: The heart size and mediastinal contours are within normal limits.
Both lungs are clear. The visualized skeletal structures are
unremarkable.
IMPRESSION: No active cardiopulmonary disease.

## 2015-06-02 ENCOUNTER — Other Ambulatory Visit (HOSPITAL_COMMUNITY): Payer: Self-pay | Admitting: Physical Medicine and Rehabilitation

## 2015-06-02 ENCOUNTER — Ambulatory Visit (HOSPITAL_COMMUNITY)
Admission: RE | Admit: 2015-06-02 | Discharge: 2015-06-02 | Disposition: A | Discharge status: Home / Self Care | Payer: Medicare HMO | Source: Ambulatory Visit | Attending: Physical Medicine and Rehabilitation | Admitting: Physical Medicine and Rehabilitation

## 2015-06-02 DIAGNOSIS — M47817 Spondylosis without myelopathy or radiculopathy, lumbosacral region: Secondary | ICD-10-CM

## 2015-06-10 ENCOUNTER — Ambulatory Visit (HOSPITAL_COMMUNITY)
Admission: RE | Admit: 2015-06-10 | Discharge: 2015-06-10 | Disposition: A | Discharge status: Home / Self Care | Payer: Medicare HMO | Source: Ambulatory Visit | Attending: Physical Medicine and Rehabilitation | Admitting: Physical Medicine and Rehabilitation

## 2015-06-10 DIAGNOSIS — M5126 Other intervertebral disc displacement, lumbar region: Secondary | ICD-10-CM | POA: Diagnosis not present

## 2015-06-10 DIAGNOSIS — M4806 Spinal stenosis, lumbar region: Secondary | ICD-10-CM | POA: Diagnosis not present

## 2015-06-10 DIAGNOSIS — M545 Low back pain: Secondary | ICD-10-CM | POA: Insufficient documentation

## 2015-06-10 DIAGNOSIS — M79605 Pain in left leg: Secondary | ICD-10-CM | POA: Insufficient documentation

## 2015-06-10 DIAGNOSIS — M79604 Pain in right leg: Secondary | ICD-10-CM | POA: Diagnosis present

## 2016-08-10 DIAGNOSIS — G894 Chronic pain syndrome: Secondary | ICD-10-CM | POA: Diagnosis not present

## 2016-08-10 DIAGNOSIS — M461 Sacroiliitis, not elsewhere classified: Secondary | ICD-10-CM | POA: Diagnosis not present

## 2016-08-10 DIAGNOSIS — M47817 Spondylosis without myelopathy or radiculopathy, lumbosacral region: Secondary | ICD-10-CM | POA: Diagnosis not present

## 2016-08-10 DIAGNOSIS — M5417 Radiculopathy, lumbosacral region: Secondary | ICD-10-CM | POA: Diagnosis not present

## 2016-08-30 DIAGNOSIS — M1711 Unilateral primary osteoarthritis, right knee: Secondary | ICD-10-CM | POA: Diagnosis not present

## 2016-09-30 DIAGNOSIS — M179 Osteoarthritis of knee, unspecified: Secondary | ICD-10-CM | POA: Diagnosis not present

## 2016-09-30 DIAGNOSIS — M5137 Other intervertebral disc degeneration, lumbosacral region: Secondary | ICD-10-CM | POA: Diagnosis not present

## 2016-09-30 DIAGNOSIS — I10 Essential (primary) hypertension: Secondary | ICD-10-CM | POA: Diagnosis not present

## 2016-09-30 DIAGNOSIS — G894 Chronic pain syndrome: Secondary | ICD-10-CM | POA: Diagnosis not present

## 2016-10-06 DIAGNOSIS — G894 Chronic pain syndrome: Secondary | ICD-10-CM | POA: Diagnosis not present

## 2016-10-06 DIAGNOSIS — M461 Sacroiliitis, not elsewhere classified: Secondary | ICD-10-CM | POA: Diagnosis not present

## 2016-10-06 DIAGNOSIS — M47817 Spondylosis without myelopathy or radiculopathy, lumbosacral region: Secondary | ICD-10-CM | POA: Diagnosis not present

## 2016-10-06 DIAGNOSIS — M5417 Radiculopathy, lumbosacral region: Secondary | ICD-10-CM | POA: Diagnosis not present

## 2016-12-17 ENCOUNTER — Other Ambulatory Visit: Payer: Self-pay | Admitting: Orthopedic Surgery

## 2016-12-17 NOTE — Progress Notes (Addendum)
PCP: Dr. Jeni Salles in [facility]  Cardiologist: pt denies  EKG: pt denies past year  Stress test: pt denies ever  ECHO: pt denies ever  Cardiac Cath: pt denies ever  Chest x-ray: pt deies past year, no recent respiratory complications/infections

## 2016-12-17 NOTE — Pre-Procedure Instructions (Signed)
Joseph Ellison  12/17/2016      [facility] - [facility]ison, [place], [place] - [place]ison [address] Phone: [phone] Fax: [place], [place] [address] [place] [address] Phone: [phone] Fax: [phone]    Your procedure is scheduled on December 28, 2016.  Report to [facility] Admitting at 530 AM.  Call this number if you have problems the morning of surgery:  [phone]   Remember:  Do not eat food or drink liquids after midnight.  Take these medicines the morning of surgery with A SIP OF WATER albuterol nebulizer-if needed, cyclobenzaprine (flexeril)-if needed for muscle spasms, diazepam (valium)-if needed, gabapentin (neurontin), omeprazole (prilosec), oxycodone-acetaminophen (percocet)-if needed for pain, tamsulosin (flomax), eye drops, venlafaxine XR (effexor-XR)-if needed  7 days prior to surgery STOP taking any Aspirin (unless otherwise instructed by your surgeon), Aleve, Naproxen, Ibuprofen, Motrin, Advil, Goody's, BC's, all herbal medications, fish oil, and all vitamins  Continue all other medications as instructed by your physician except follow the above medication instructions before surgery   Do not wear jewelry, make-up or nail polish.  Do not wear lotions, powders, or perfumes, or deoderant.   Men may shave face and neck.  Do not bring valuables to the hospital.  [facility] is not responsible for any belongings or valuables.  Contacts, dentures or bridgework may not be worn into surgery.  Leave your suitcase in the car.  After surgery it may be brought to your room.  For patients admitted to the hospital, discharge time will be determined by your treatment team.  Patients discharged the day of surgery will not be allowed to drive home.   Special instructions:   [facility]- Preparing For Surgery  Before surgery, you can play an important role.  Because skin is not sterile, your skin needs to be as free of germs as possible. You can reduce the number of germs on your skin by washing with CHG (chlorahexidine gluconate) Soap before surgery.  CHG is an antiseptic cleaner which kills germs and bonds with the skin to continue killing germs even after washing.  Please do not use if you have an allergy to CHG or antibacterial soaps. If your skin becomes reddened/irritated stop using the CHG.  Do not shave (including legs and underarms) for at least 48 hours prior to first CHG shower. It is OK to shave your face.  Please follow these instructions carefully.   1. Shower the NIGHT BEFORE SURGERY and the MORNING OF SURGERY with CHG.   2. If you chose to wash your hair, wash your hair first as usual with your normal shampoo.  3. After you shampoo, rinse your hair and body thoroughly to remove the shampoo.  4. Use CHG as you would any other liquid soap. You can apply CHG directly to the skin and wash gently with a scrungie or a clean washcloth.   5. Apply the CHG Soap to your body ONLY FROM THE NECK DOWN.  Do not use on open wounds or open sores. Avoid contact with your eyes, ears, mouth and genitals (private parts). Wash Face and genitals (private parts)  with your normal soap.  6. Wash thoroughly, paying special attention to the area where your surgery will be performed.  7. Thoroughly rinse your body with warm water from the neck down.  8. DO NOT shower/wash with your normal  soap after using and rinsing off the CHG Soap.  9. Pat yourself dry with a CLEAN TOWEL.  10. Wear CLEAN PAJAMAS to bed the night before surgery, wear comfortable clothes the morning of surgery  11. Place CLEAN SHEETS on your bed the night of your first shower and DO NOT SLEEP WITH PETS.    Day of Surgery: Do not apply any deodorants/lotions. Please wear clean clothes to the hospital/surgery center.     Please read over the following fact sheets that you were  given. Pain Booklet, Coughing and Deep Breathing, MRSA Information and Surgical Site Infection Prevention

## 2016-12-20 ENCOUNTER — Encounter (HOSPITAL_COMMUNITY)
Admission: RE | Admit: 2016-12-20 | Discharge: 2016-12-20 | Disposition: A | Discharge status: Home / Self Care | Payer: Medicare Other | Source: Ambulatory Visit | Attending: Orthopedic Surgery | Admitting: Orthopedic Surgery

## 2016-12-20 ENCOUNTER — Encounter (HOSPITAL_COMMUNITY): Payer: Self-pay

## 2016-12-20 DIAGNOSIS — Z01818 Encounter for other preprocedural examination: Secondary | ICD-10-CM | POA: Diagnosis present

## 2016-12-20 DIAGNOSIS — M1711 Unilateral primary osteoarthritis, right knee: Secondary | ICD-10-CM | POA: Diagnosis not present

## 2016-12-20 HISTORY — DX: Unspecified osteoarthritis, unspecified site: M19.90

## 2016-12-20 LAB — BASIC METABOLIC PANEL
Anion gap: 8 (ref 5–15)
BUN: 10 mg/dL (ref 6–20)
CO2: 26 mmol/L (ref 22–32)
Calcium: 8.6 mg/dL — ABNORMAL LOW (ref 8.9–10.3)
Chloride: 102 mmol/L (ref 101–111)
Creatinine, Ser: 0.62 mg/dL (ref 0.61–1.24)
GFR calc Af Amer: >60 mL/min (ref >60)
GFR calc non Af Amer: >60 mL/min (ref >60)
Glucose, Bld: 92 mg/dL (ref 65–99)
Potassium: 4.2 mmol/L (ref 3.5–5.1)
Sodium: 136 mmol/L (ref 135–145)

## 2016-12-20 LAB — CBC
HCT: 41.6 % (ref 39.0–52.0)
Hemoglobin: 13.4 g/dL (ref 13.0–17.0)
MCH: 30.7 pg (ref 26.0–34.0)
MCHC: 32.2 g/dL (ref 30.0–36.0)
MCV: 95.4 fL (ref 78.0–100.0)
Platelets: 270 10*3/uL (ref 150–400)
RBC: 4.36 MIL/uL (ref 4.22–5.81)
RDW: 14 % (ref 11.5–15.5)
WBC: 12.3 10*3/uL — ABNORMAL HIGH (ref 4.0–10.5)

## 2016-12-20 LAB — SURGICAL PCR SCREEN
MRSA, PCR: NEGATIVE
Staphylococcus aureus: NEGATIVE

## 2016-12-27 NOTE — Anesthesia Preprocedure Evaluation (Addendum)
Anesthesia Evaluation  Patient identified by MRN, date of birth, ID band Patient awake    Reviewed: Allergy & Precautions, H&P , Patient's Chart, lab work & pertinent test results, reviewed documented beta blocker date and time   Airway Mallampati: II  TM Distance: >3 FB Neck ROM: full    Dental no notable dental hx.    Pulmonary COPD, Current Smoker,    Pulmonary exam normal breath sounds clear to auscultation       Cardiovascular hypertension,  Rhythm:regular Rate:Normal     Neuro/Psych    GI/Hepatic   Endo/Other    Renal/GU      Musculoskeletal   Abdominal   Peds  Hematology   Anesthesia Other Findings COPD     Back pain, chronic   GERD     Neurogenic pain, leg   Hypertension    Depression   MRSA          Reproductive/Obstetrics                             Anesthesia Physical Anesthesia Plan  ASA: III  Anesthesia Plan: Spinal   Post-op Pain Management:  Regional for Post-op pain   Induction:   PONV Risk Score and Plan: 1 and Ondansetron, Dexamethasone and Treatment may vary due to age or medical condition  Airway Management Planned:   Additional Equipment:   Intra-op Plan:   Post-operative Plan:   Informed Consent: I have reviewed the patients History and Physical, chart, labs and discussed the procedure including the risks, benefits and alternatives for the proposed anesthesia with the patient or authorized representative who has indicated his/her understanding and acceptance.   Dental Advisory Given  Plan Discussed with: CRNA and Surgeon  Anesthesia Plan Comments: (  )       Anesthesia Quick Evaluation

## 2016-12-28 ENCOUNTER — Ambulatory Visit (HOSPITAL_COMMUNITY): Payer: Medicare Other | Admitting: Anesthesiology

## 2016-12-28 ENCOUNTER — Encounter (HOSPITAL_COMMUNITY)
Admission: RE | Disposition: A | Discharge status: Home / Self Care | Payer: Self-pay | Source: Ambulatory Visit | Attending: Orthopedic Surgery

## 2016-12-28 ENCOUNTER — Encounter (HOSPITAL_COMMUNITY): Payer: Self-pay | Admitting: *Deleted

## 2016-12-28 ENCOUNTER — Observation Stay (HOSPITAL_COMMUNITY)
Admission: RE | Admit: 2016-12-28 | Discharge: 2016-12-29 | Disposition: A | Discharge status: Home / Self Care | Payer: Medicare Other | Source: Ambulatory Visit | Attending: Orthopedic Surgery | Admitting: Orthopedic Surgery

## 2016-12-28 ENCOUNTER — Observation Stay (HOSPITAL_COMMUNITY): Payer: Medicare Other

## 2016-12-28 DIAGNOSIS — F329 Major depressive disorder, single episode, unspecified: Secondary | ICD-10-CM | POA: Diagnosis not present

## 2016-12-28 DIAGNOSIS — J449 Chronic obstructive pulmonary disease, unspecified: Secondary | ICD-10-CM | POA: Diagnosis not present

## 2016-12-28 DIAGNOSIS — Z7901 Long term (current) use of anticoagulants: Secondary | ICD-10-CM | POA: Diagnosis not present

## 2016-12-28 DIAGNOSIS — I10 Essential (primary) hypertension: Secondary | ICD-10-CM | POA: Insufficient documentation

## 2016-12-28 DIAGNOSIS — Z79899 Other long term (current) drug therapy: Secondary | ICD-10-CM | POA: Diagnosis not present

## 2016-12-28 DIAGNOSIS — Z96651 Presence of right artificial knee joint: Secondary | ICD-10-CM

## 2016-12-28 DIAGNOSIS — Z880 Allergy status to penicillin: Secondary | ICD-10-CM | POA: Insufficient documentation

## 2016-12-28 DIAGNOSIS — Z96659 Presence of unspecified artificial knee joint: Secondary | ICD-10-CM

## 2016-12-28 DIAGNOSIS — K219 Gastro-esophageal reflux disease without esophagitis: Secondary | ICD-10-CM | POA: Insufficient documentation

## 2016-12-28 DIAGNOSIS — F17200 Nicotine dependence, unspecified, uncomplicated: Secondary | ICD-10-CM | POA: Diagnosis not present

## 2016-12-28 DIAGNOSIS — M1711 Unilateral primary osteoarthritis, right knee: Principal | ICD-10-CM | POA: Diagnosis present

## 2016-12-28 HISTORY — DX: Unilateral primary osteoarthritis, right knee: M17.11

## 2016-12-28 HISTORY — PX: PARTIAL KNEE ARTHROPLASTY: SHX2174

## 2016-12-28 SURGERY — ARTHROPLASTY, KNEE, UNICOMPARTMENTAL
Anesthesia: Spinal | Site: Knee | Laterality: Right

## 2016-12-28 MED ORDER — METOCLOPRAMIDE HCL 5 MG PO TABS: 5.0000 mg | ORAL_TABLET | Freq: Three times a day (TID) | ORAL | Status: DC | PRN

## 2016-12-28 MED ORDER — ONDANSETRON HCL 4 MG/2ML IJ SOLN
INTRAMUSCULAR | Status: DC | PRN
  Administered 2016-12-28: 4 mg via INTRAVENOUS

## 2016-12-28 MED ORDER — SENNA-DOCUSATE SODIUM 8.6-50 MG PO TABS: 2.0000 | ORAL_TABLET | Freq: Every day | ORAL | 1 refills | Status: DC

## 2016-12-28 MED ORDER — BISACODYL 10 MG RE SUPP: 10.0000 mg | Freq: Every day | RECTAL | Status: DC | PRN

## 2016-12-28 MED ORDER — MIDAZOLAM HCL 2 MG/2ML IJ SOLN
INTRAMUSCULAR | Status: AC
  Filled 2016-12-28: qty 2

## 2016-12-28 MED ORDER — SENNA 8.6 MG PO TABS
1.0000 | ORAL_TABLET | Freq: Two times a day (BID) | ORAL | Status: DC
  Administered 2016-12-28 – 2016-12-29 (×3): 8.6 mg via ORAL
  Filled 2016-12-28 (×3): qty 1

## 2016-12-28 MED ORDER — PANTOPRAZOLE SODIUM 40 MG PO TBEC
80.0000 mg | DELAYED_RELEASE_TABLET | Freq: Every day | ORAL | Status: DC
  Administered 2016-12-28 – 2016-12-29 (×2): 80 mg via ORAL
  Filled 2016-12-28 (×2): qty 2

## 2016-12-28 MED ORDER — OXYCODONE HCL 5 MG PO TABS: 5.0000 mg | ORAL_TABLET | ORAL | Status: DC | PRN

## 2016-12-28 MED ORDER — LUBIPROSTONE 24 MCG PO CAPS
24.0000 ug | ORAL_CAPSULE | Freq: Two times a day (BID) | ORAL | Status: DC
  Administered 2016-12-28 – 2016-12-29 (×2): 24 ug via ORAL
  Filled 2016-12-28 (×3): qty 1

## 2016-12-28 MED ORDER — SODIUM CHLORIDE 0.9 % IR SOLN
Status: DC | PRN
  Administered 2016-12-28: 3000 mL

## 2016-12-28 MED ORDER — PROPOFOL 500 MG/50ML IV EMUL
INTRAVENOUS | Status: DC | PRN
  Administered 2016-12-28: 75 ug/kg/min via INTRAVENOUS

## 2016-12-28 MED ORDER — LIDOCAINE 2% (20 MG/ML) 5 ML SYRINGE
INTRAMUSCULAR | Status: DC | PRN
  Administered 2016-12-28: 100 mg via INTRAVENOUS

## 2016-12-28 MED ORDER — TRAZODONE HCL 100 MG PO TABS
100.0000 mg | ORAL_TABLET | Freq: Every day | ORAL | Status: DC
  Administered 2016-12-28: 100 mg via ORAL
  Filled 2016-12-28: qty 1

## 2016-12-28 MED ORDER — HYDROMORPHONE HCL 1 MG/ML IJ SOLN
1.0000 mg | INTRAMUSCULAR | Status: DC | PRN
  Administered 2016-12-28: 1 mg via INTRAVENOUS
  Filled 2016-12-28: qty 1

## 2016-12-28 MED ORDER — FLUTICASONE PROPIONATE 50 MCG/ACT NA SUSP
1.0000 | Freq: Every day | NASAL | Status: DC
  Administered 2016-12-29: 1 via NASAL
  Filled 2016-12-28: qty 16

## 2016-12-28 MED ORDER — KETOROLAC TROMETHAMINE 15 MG/ML IJ SOLN
7.5000 mg | Freq: Four times a day (QID) | INTRAMUSCULAR | Status: AC
  Administered 2016-12-28 (×2): 7.5 mg via INTRAVENOUS
  Filled 2016-12-28 (×2): qty 1

## 2016-12-28 MED ORDER — PHENOL 1.4 % MT LIQD: 1.0000 | OROMUCOSAL | Status: DC | PRN

## 2016-12-28 MED ORDER — POTASSIUM CHLORIDE IN NACL 20-0.45 MEQ/L-% IV SOLN
INTRAVENOUS | Status: DC
  Filled 2016-12-28: qty 1000

## 2016-12-28 MED ORDER — KETOROLAC TROMETHAMINE 30 MG/ML IJ SOLN
INTRAMUSCULAR | Status: AC
  Filled 2016-12-28: qty 1

## 2016-12-28 MED ORDER — GABAPENTIN 300 MG PO CAPS
300.0000 mg | ORAL_CAPSULE | Freq: Three times a day (TID) | ORAL | Status: DC
Start: 2016-12-28 — End: 2016-12-29
  Filled 2016-12-28 (×3): qty 1

## 2016-12-28 MED ORDER — HYDROMORPHONE HCL 2 MG PO TABS: 2.0000 mg | ORAL_TABLET | ORAL | 0 refills | Status: DC | PRN

## 2016-12-28 MED ORDER — TETRAHYDROZOLINE HCL 0.05 % OP SOLN: 2.0000 [drp] | OPHTHALMIC | Status: DC | PRN

## 2016-12-28 MED ORDER — DOCUSATE SODIUM 100 MG PO CAPS
100.0000 mg | ORAL_CAPSULE | Freq: Two times a day (BID) | ORAL | Status: DC
  Administered 2016-12-28 – 2016-12-29 (×3): 100 mg via ORAL
  Filled 2016-12-28 (×3): qty 1

## 2016-12-28 MED ORDER — CEFAZOLIN SODIUM-DEXTROSE 2-4 GM/100ML-% IV SOLN
2.0000 g | INTRAVENOUS | Status: AC
  Administered 2016-12-28: 2 g via INTRAVENOUS
  Filled 2016-12-28: qty 100

## 2016-12-28 MED ORDER — MAGNESIUM CITRATE PO SOLN: 1.0000 | Freq: Once | ORAL | Status: DC | PRN

## 2016-12-28 MED ORDER — OXYCODONE HCL 5 MG PO TABS
ORAL_TABLET | ORAL | Status: AC
  Filled 2016-12-28: qty 2

## 2016-12-28 MED ORDER — ONDANSETRON HCL 4 MG PO TABS: 4.0000 mg | ORAL_TABLET | Freq: Three times a day (TID) | ORAL | 0 refills | Status: DC | PRN

## 2016-12-28 MED ORDER — DIPHENHYDRAMINE HCL 12.5 MG/5ML PO ELIX: 12.5000 mg | ORAL_SOLUTION | ORAL | Status: DC | PRN

## 2016-12-28 MED ORDER — TAMSULOSIN HCL 0.4 MG PO CAPS
0.4000 mg | ORAL_CAPSULE | Freq: Every day | ORAL | Status: DC
  Administered 2016-12-28 – 2016-12-29 (×2): 0.4 mg via ORAL
  Filled 2016-12-28 (×2): qty 1

## 2016-12-28 MED ORDER — CYCLOBENZAPRINE HCL 5 MG PO TABS: 5.0000 mg | ORAL_TABLET | Freq: Three times a day (TID) | ORAL | 0 refills | Status: DC | PRN

## 2016-12-28 MED ORDER — PROPOFOL 10 MG/ML IV BOLUS
INTRAVENOUS | Status: DC | PRN
  Administered 2016-12-28: 20 mg via INTRAVENOUS
  Administered 2016-12-28: 30 mg via INTRAVENOUS

## 2016-12-28 MED ORDER — OXYCODONE HCL 5 MG PO TABS
10.0000 mg | ORAL_TABLET | ORAL | Status: DC | PRN
  Administered 2016-12-28: 20 mg via ORAL
  Administered 2016-12-28: 10 mg via ORAL
  Administered 2016-12-28 – 2016-12-29 (×8): 20 mg via ORAL
  Filled 2016-12-28 (×9): qty 4

## 2016-12-28 MED ORDER — ALBUTEROL SULFATE (2.5 MG/3ML) 0.083% IN NEBU: 2.5000 mg | INHALATION_SOLUTION | Freq: Four times a day (QID) | RESPIRATORY_TRACT | Status: DC | PRN

## 2016-12-28 MED ORDER — POLYETHYLENE GLYCOL 3350 17 G PO PACK: 17.0000 g | PACK | Freq: Every day | ORAL | Status: DC | PRN

## 2016-12-28 MED ORDER — DIAZEPAM 5 MG PO TABS: 5.0000 mg | ORAL_TABLET | Freq: Three times a day (TID) | ORAL | 0 refills | Status: AC

## 2016-12-28 MED ORDER — OXYCODONE-ACETAMINOPHEN 10-325 MG PO TABS: 1.0000 | ORAL_TABLET | ORAL | 0 refills | Status: AC | PRN

## 2016-12-28 MED ORDER — KETOROLAC TROMETHAMINE 15 MG/ML IJ SOLN
INTRAMUSCULAR | Status: AC
  Filled 2016-12-28: qty 1

## 2016-12-28 MED ORDER — DEXAMETHASONE SODIUM PHOSPHATE 10 MG/ML IJ SOLN: 10.0000 mg | Freq: Once | INTRAMUSCULAR | Status: DC

## 2016-12-28 MED ORDER — ONDANSETRON HCL 4 MG/2ML IJ SOLN
4.0000 mg | Freq: Four times a day (QID) | INTRAMUSCULAR | Status: DC | PRN
Start: 2016-12-28 — End: 2016-12-29

## 2016-12-28 MED ORDER — ONDANSETRON HCL 4 MG PO TABS: 4.0000 mg | ORAL_TABLET | Freq: Four times a day (QID) | ORAL | Status: DC | PRN

## 2016-12-28 MED ORDER — ONDANSETRON HCL 4 MG/2ML IJ SOLN
INTRAMUSCULAR | Status: AC
  Filled 2016-12-28: qty 2

## 2016-12-28 MED ORDER — DEXAMETHASONE SODIUM PHOSPHATE 10 MG/ML IJ SOLN
INTRAMUSCULAR | Status: AC
  Filled 2016-12-28: qty 1

## 2016-12-28 MED ORDER — METHOCARBAMOL 500 MG PO TABS
500.0000 mg | ORAL_TABLET | Freq: Four times a day (QID) | ORAL | Status: DC | PRN
  Administered 2016-12-28 – 2016-12-29 (×3): 500 mg via ORAL
  Filled 2016-12-28 (×2): qty 1

## 2016-12-28 MED ORDER — 0.9 % SODIUM CHLORIDE (POUR BTL) OPTIME
TOPICAL | Status: DC | PRN
  Administered 2016-12-28: 1000 mL

## 2016-12-28 MED ORDER — LIDOCAINE 2% (20 MG/ML) 5 ML SYRINGE
INTRAMUSCULAR | Status: AC
  Filled 2016-12-28: qty 5

## 2016-12-28 MED ORDER — RIVAROXABAN 10 MG PO TABS: 10.0000 mg | ORAL_TABLET | Freq: Every day | ORAL | 0 refills | Status: DC

## 2016-12-28 MED ORDER — ALUM & MAG HYDROXIDE-SIMETH 200-200-20 MG/5ML PO SUSP: 30.0000 mL | ORAL | Status: DC | PRN

## 2016-12-28 MED ORDER — METHOCARBAMOL 1000 MG/10ML IJ SOLN
500.0000 mg | Freq: Four times a day (QID) | INTRAMUSCULAR | Status: DC | PRN
  Filled 2016-12-28: qty 5

## 2016-12-28 MED ORDER — BUPIVACAINE-EPINEPHRINE (PF) 0.5% -1:200000 IJ SOLN
INTRAMUSCULAR | Status: DC | PRN
  Administered 2016-12-28: 20 mL via PERINEURAL

## 2016-12-28 MED ORDER — PROPOFOL 10 MG/ML IV BOLUS
INTRAVENOUS | Status: AC
  Filled 2016-12-28: qty 20

## 2016-12-28 MED ORDER — METOCLOPRAMIDE HCL 5 MG/ML IJ SOLN: 5.0000 mg | Freq: Three times a day (TID) | INTRAMUSCULAR | Status: DC | PRN

## 2016-12-28 MED ORDER — MIDAZOLAM HCL 5 MG/5ML IJ SOLN
INTRAMUSCULAR | Status: DC | PRN
  Administered 2016-12-28: 2 mg via INTRAVENOUS

## 2016-12-28 MED ORDER — ACETAMINOPHEN 650 MG RE SUPP: 650.0000 mg | RECTAL | Status: DC | PRN

## 2016-12-28 MED ORDER — VENLAFAXINE HCL ER 75 MG PO CP24: 150.0000 mg | ORAL_CAPSULE | Freq: Every day | ORAL | Status: DC

## 2016-12-28 MED ORDER — DEXAMETHASONE SODIUM PHOSPHATE 10 MG/ML IJ SOLN
INTRAMUSCULAR | Status: DC | PRN
  Administered 2016-12-28: 10 mg via INTRAVENOUS

## 2016-12-28 MED ORDER — RIVAROXABAN 10 MG PO TABS
10.0000 mg | ORAL_TABLET | Freq: Every day | ORAL | Status: DC
  Administered 2016-12-29: 10 mg via ORAL
  Filled 2016-12-28: qty 1

## 2016-12-28 MED ORDER — BUPIVACAINE HCL (PF) 0.25 % IJ SOLN
INTRAMUSCULAR | Status: AC
  Filled 2016-12-28: qty 30

## 2016-12-28 MED ORDER — LISINOPRIL 20 MG PO TABS
20.0000 mg | ORAL_TABLET | Freq: Every day | ORAL | Status: DC
  Administered 2016-12-28 – 2016-12-29 (×2): 20 mg via ORAL
  Filled 2016-12-28 (×2): qty 1

## 2016-12-28 MED ORDER — DIAZEPAM 5 MG PO TABS
5.0000 mg | ORAL_TABLET | Freq: Three times a day (TID) | ORAL | Status: DC
  Administered 2016-12-28 – 2016-12-29 (×3): 5 mg via ORAL
  Filled 2016-12-28 (×3): qty 1

## 2016-12-28 MED ORDER — MENTHOL 3 MG MT LOZG: 1.0000 | LOZENGE | OROMUCOSAL | Status: DC | PRN

## 2016-12-28 MED ORDER — ACETAMINOPHEN 325 MG PO TABS
650.0000 mg | ORAL_TABLET | ORAL | Status: DC | PRN
  Administered 2016-12-28: 650 mg via ORAL
  Filled 2016-12-28: qty 2

## 2016-12-28 MED ORDER — FENTANYL CITRATE (PF) 250 MCG/5ML IJ SOLN
INTRAMUSCULAR | Status: AC
  Filled 2016-12-28: qty 5

## 2016-12-28 MED ORDER — CYCLOBENZAPRINE HCL 5 MG PO TABS
5.0000 mg | ORAL_TABLET | Freq: Three times a day (TID) | ORAL | Status: DC | PRN
  Administered 2016-12-29: 5 mg via ORAL
  Filled 2016-12-28: qty 1

## 2016-12-28 MED ORDER — METHOCARBAMOL 500 MG PO TABS
ORAL_TABLET | ORAL | Status: AC
  Filled 2016-12-28: qty 1

## 2016-12-28 MED ORDER — FENTANYL CITRATE (PF) 100 MCG/2ML IJ SOLN
INTRAMUSCULAR | Status: DC | PRN
Start: 2016-12-28 — End: 2016-12-28
  Administered 2016-12-28: 50 ug via INTRAVENOUS

## 2016-12-28 MED ORDER — LACTATED RINGERS IV SOLN
INTRAVENOUS | Status: DC | PRN
  Administered 2016-12-28 (×2): via INTRAVENOUS

## 2016-12-28 MED ORDER — FENTANYL CITRATE (PF) 100 MCG/2ML IJ SOLN: 25.0000 ug | INTRAMUSCULAR | Status: DC | PRN

## 2016-12-28 MED ORDER — CEFAZOLIN SODIUM-DEXTROSE 2-4 GM/100ML-% IV SOLN
2.0000 g | Freq: Four times a day (QID) | INTRAVENOUS | Status: AC
  Administered 2016-12-28 (×2): 2 g via INTRAVENOUS
  Filled 2016-12-28 (×2): qty 100

## 2016-12-28 SURGICAL SUPPLY — 55 items
BANDAGE ESMARK 6X9 LF (GAUZE/BANDAGES/DRESSINGS) ×1 IMPLANT
BNDG CMPR 9X6 STRL LF SNTH (GAUZE/BANDAGES/DRESSINGS) ×1
BNDG CMPR MED 15X6 ELC VLCR LF (GAUZE/BANDAGES/DRESSINGS) ×1
BNDG ELASTIC 6X15 VLCR STRL LF (GAUZE/BANDAGES/DRESSINGS) ×2 IMPLANT
BNDG ESMARK 6X9 LF (GAUZE/BANDAGES/DRESSINGS) ×2
BOWL SMART MIX CTS (DISPOSABLE) ×2 IMPLANT
CAPT KNEE PARTIAL 2 ×1 IMPLANT
CEMENT HV SMART SET (Cement) ×2 IMPLANT
CLSR STERI-STRIP ANTIMIC 1/2X4 (GAUZE/BANDAGES/DRESSINGS) ×2 IMPLANT
COVER SURGICAL LIGHT HANDLE (MISCELLANEOUS) ×2 IMPLANT
CUFF TOURNIQUET SINGLE 34IN LL (TOURNIQUET CUFF) ×2 IMPLANT
DRAPE EXTREMITY T 121X128X90 (DRAPE) ×1 IMPLANT
DRAPE HALF SHEET 40X57 (DRAPES) ×2 IMPLANT
DRAPE U-SHAPE 47X51 STRL (DRAPES) ×2 IMPLANT
DRSG MEPILEX BORDER 4X8 (GAUZE/BANDAGES/DRESSINGS) ×1 IMPLANT
DURAPREP 26ML APPLICATOR (WOUND CARE) ×2 IMPLANT
ELECT CAUTERY BLADE 6.4 (BLADE) ×2 IMPLANT
ELECT REM PT RETURN 9FT ADLT (ELECTROSURGICAL) ×2
ELECTRODE REM PT RTRN 9FT ADLT (ELECTROSURGICAL) ×1 IMPLANT
GAUZE SPONGE 4X4 12PLY STRL (GAUZE/BANDAGES/DRESSINGS) ×2 IMPLANT
GLOVE BIOGEL PI ORTHO PRO SZ8 (GLOVE) ×2
GLOVE ORTHO TXT STRL SZ7.5 (GLOVE) ×2 IMPLANT
GLOVE PI ORTHO PRO STRL SZ8 (GLOVE) ×2 IMPLANT
GLOVE SURG ORTHO 8.0 STRL STRW (GLOVE) ×2 IMPLANT
GOWN STRL REUS W/ TWL XL LVL3 (GOWN DISPOSABLE) ×1 IMPLANT
GOWN STRL REUS W/TWL 2XL LVL3 (GOWN DISPOSABLE) ×2 IMPLANT
GOWN STRL REUS W/TWL XL LVL3 (GOWN DISPOSABLE) ×2
HANDPIECE INTERPULSE COAX TIP (DISPOSABLE) ×2
HOOD PEEL AWAY FACE SHEILD DIS (HOOD) ×2 IMPLANT
HOOD PEEL AWAY FLYTE STAYCOOL (MISCELLANEOUS) ×2 IMPLANT
IMMOBILIZER KNEE 22 UNIV (SOFTGOODS) ×2 IMPLANT
KIT BASIN OR (CUSTOM PROCEDURE TRAY) ×2 IMPLANT
KIT ROOM TURNOVER OR (KITS) ×2 IMPLANT
MANIFOLD NEPTUNE II (INSTRUMENTS) ×2 IMPLANT
NDL HYPO 21X1.5 SAFETY (NEEDLE) IMPLANT
NEEDLE HYPO 21X1.5 SAFETY (NEEDLE) IMPLANT
NS IRRIG 1000ML POUR BTL (IV SOLUTION) ×2 IMPLANT
PACK BLADE SAW RECIP 70 3 PT (BLADE) ×1 IMPLANT
PACK TOTAL JOINT (CUSTOM PROCEDURE TRAY) ×2 IMPLANT
PAD ABD 8X10 STRL (GAUZE/BANDAGES/DRESSINGS) ×2 IMPLANT
PAD ARMBOARD 7.5X6 YLW CONV (MISCELLANEOUS) ×4 IMPLANT
PAD CAST 4YDX4 CTTN HI CHSV (CAST SUPPLIES) ×1 IMPLANT
PADDING CAST COTTON 4X4 STRL (CAST SUPPLIES) ×2
PADDING CAST COTTON 6X4 STRL (CAST SUPPLIES) ×2 IMPLANT
SET HNDPC FAN SPRY TIP SCT (DISPOSABLE) ×1 IMPLANT
SUCTION FRAZIER HANDLE 10FR (MISCELLANEOUS) ×1
SUCTION TUBE FRAZIER 10FR DISP (MISCELLANEOUS) ×1 IMPLANT
SUT VIC AB 0 CT1 27 (SUTURE) ×2
SUT VIC AB 0 CT1 27XBRD ANBCTR (SUTURE) ×1 IMPLANT
SUT VIC AB 1 CT1 27 (SUTURE) ×4
SUT VIC AB 1 CT1 27XBRD ANBCTR (SUTURE) ×1 IMPLANT
SUT VIC AB 3-0 SH 8-18 (SUTURE) ×2 IMPLANT
SYR CONTROL 10ML LL (SYRINGE) IMPLANT
TOWEL OR 17X24 6PK STRL BLUE (TOWEL DISPOSABLE) ×3 IMPLANT
TOWEL OR 17X26 10 PK STRL BLUE (TOWEL DISPOSABLE) ×1 IMPLANT

## 2016-12-28 NOTE — Transfer of Care (Signed)
Immediate Anesthesia Transfer of Care Note  Patient: Joseph Ellison  Procedure(s) Performed: UNICOMPARTMENTAL RIGHT KNEE (Right Knee)  Patient Location: PACU  Anesthesia Type:Spinal  Level of Consciousness: awake, alert  and oriented  Airway & Oxygen Therapy: Patient Spontanous Breathing and Patient connected to face mask oxygen  Post-op Assessment: Report given to RN and Post -op Vital signs reviewed and stable  Post vital signs: Reviewed and stable  Last Vitals:  Vitals:   12/28/16 0555  BP: (!) 171/90  Pulse: (!) 59  Resp: 20  Temp: 36.6 C  SpO2: 97%    Last Pain:  Vitals:   12/28/16 0604  TempSrc:   PainSc: 8       Patients Stated Pain Goal: 4 (03/40/35 2481)  Complications: No apparent anesthesia complications

## 2016-12-28 NOTE — Evaluation (Signed)
Physical Therapy Evaluation Patient Details Name: ISAMAR WELLBROCK MRN: [id number] DOB: Nov 11, 1955 Today's Date: 12/28/2016   History of Present Illness  Pt is a 61 y/o male s/p R unicompartmental knee replacement. PMH includes HTN, depression, MRSA, COPD, chronic back pain, and smoker.  Clinical Impression  Pt is s/p surgery above with deficits below. PTA, pt was using cane for ambulation. Upon eval, pt very limited by pain and mobility limited to stand pivot to chair. Required min guard assist for mobility. Anticipate pt will progress well once pain controlled. Will be staying with his girlfriend at d/c and will need DME below. Follow up per MD arrangements. Will continue to follow acutely to maximize functional mobility independence and safety.     Follow Up Recommendations DC plan and follow up therapy as arranged by surgeon;Supervision for mobility/OOB    Equipment Recommendations  Rolling walker with 5" wheels;3in1 (PT)    Recommendations for Other Services       Precautions / Restrictions Precautions Precautions: Knee Precaution Booklet Issued: Yes (comment) Precaution Comments: Reviewed knee exercises with pt. Limited tolerance secondary to pain.  Restrictions Weight Bearing Restrictions: Yes RLE Weight Bearing: Weight bearing as tolerated      Mobility  Bed Mobility Overal bed mobility: Needs Assistance Bed Mobility: Supine to Sit     Supine to sit: Supervision     General bed mobility comments: Supervision for safety   Transfers Overall transfer level: Needs assistance Equipment used: Rolling walker (2 wheeled) Transfers: Sit to/from [facility] Sit to Stand: Min guard Stand pivot transfers: Min guard       General transfer comment: Min guard for safety. Verbal cues for safe hand placement. Verbal cues for sequencing with RW when performing transfer. Pt crying out whenever performing transfer to chair secondary to pain.    Ambulation/Gait             General Gait Details: Not tested secondary to pain   Stairs            Wheelchair Mobility    Modified Rankin (Stroke Patients Only)       Balance Overall balance assessment: Needs assistance Sitting-balance support: No upper extremity supported;Feet supported Sitting balance-Leahy Scale: Good     Standing balance support: Bilateral upper extremity supported;During functional activity Standing balance-Leahy Scale: Poor Standing balance comment: Reliant on UE support for balance                              Pertinent Vitals/Pain Pain Assessment: 0-10 Pain Score: 10-Worst pain ever Pain Location: R knee  Pain Descriptors / Indicators: Aching;Grimacing Pain Intervention(s): Limited activity within patient's tolerance;Monitored during session;Repositioned    Home Living Family/patient expects to be discharged to:: Private residence Living Arrangements: Spouse/significant other (going to stay with his girlfriend ) Available Help at Discharge: Family;Available 24 hours/day Type of Home: House Home Access: Stairs to enter Entrance Stairs-Rails: None Entrance Stairs-Number of Steps: 1 (threshold step ) Home Layout: One level Home Equipment: Cane - single point      Prior Function Level of Independence: Independent with assistive device(s)         Comments: Was using cane for ambulation      Hand Dominance        Extremity/Trunk Assessment   Upper Extremity Assessment Upper Extremity Assessment: Defer to OT evaluation    Lower Extremity Assessment Lower Extremity Assessment: RLE deficits/detail RLE Deficits / Details: Sensory in tact.  Deficits consistent with post op pain and weakness. Limited tolerance for ther ex this session.     Cervical / Trunk Assessment Cervical / Trunk Assessment: Normal  Communication   Communication: No difficulties  Cognition Arousal/Alertness: Awake/alert Behavior During  Therapy: WFL for tasks assessed/performed Overall Cognitive Status: Within Functional Limits for tasks assessed                                        General Comments General comments (skin integrity, edema, etc.): Educated not to have pillow under knee following surgery     Exercises Total Joint Exercises Ankle Circles/Pumps: AROM;20 reps;Both Quad Sets: AROM;Right;10 reps Towel Squeeze: AROM;Both;10 reps Hip ABduction/ADduction: AROM;Right;10 reps   Assessment/Plan    PT Assessment Patient needs continued PT services  PT Problem List Decreased strength;Decreased range of motion;Decreased activity tolerance;Decreased mobility;Decreased balance;Decreased knowledge of use of DME;Decreased knowledge of precautions;Pain       PT Treatment Interventions DME instruction;Gait training;Stair training;Functional mobility training;Therapeutic activities;Therapeutic exercise;Neuromuscular re-education;Balance training;Patient/family education    PT Goals (Current goals can be found in the Care Plan section)  Acute Rehab PT Goals Patient Stated Goal: to decrease pain  PT Goal Formulation: With patient Time For Goal Achievement: 01/04/17 Potential to Achieve Goals: Good    Frequency 7X/week   Barriers to discharge        Co-evaluation               AM-PAC PT "6 Clicks" Daily Activity  Outcome Measure Difficulty turning over in bed (including adjusting bedclothes, sheets and blankets)?: None Difficulty moving from lying on back to sitting on the side of the bed? : None Difficulty sitting down on and standing up from a chair with arms (e.g., wheelchair, bedside commode, etc,.)?: Unable Help needed moving to and from a bed to chair (including a wheelchair)?: A Little Help needed walking in hospital room?: A Little Help needed climbing 3-5 steps with a railing? : A Lot 6 Click Score: 17    End of Session Equipment Utilized During Treatment: Gait belt;Right  knee immobilizer Activity Tolerance: Patient limited by pain Patient left: in chair;with call bell/phone within reach Nurse Communication: Mobility status PT Visit Diagnosis: Other abnormalities of gait and mobility (R26.89);Pain Pain - Right/Left: Right Pain - part of body: Knee    Time: 9604-5409 PT Time Calculation (min) (ACUTE ONLY): 23 min   Charges:   PT Evaluation $PT Eval Low Complexity: 1 Low PT Treatments $Therapeutic Activity: 8-22 mins   PT G Codes:   PT G-Codes **NOT FOR INPATIENT CLASS** Functional Assessment Tool Used: AM-PAC 6 Clicks Basic Mobility;Clinical judgement Functional Limitation: Mobility: Walking and moving around Mobility: Walking and Moving Around Current Status (W1191): At least 40 percent but less than 60 percent impaired, limited or restricted Mobility: Walking and Moving Around Goal Status [phone]): At least 1 percent but less than 20 percent impaired, limited or restricted    Leighton Ruff, PT, DPT  Acute Rehabilitation Services  Pager: [phone]   Rudean Hitt 12/28/2016, 2:47 PM

## 2016-12-28 NOTE — Anesthesia Procedure Notes (Signed)
Anesthesia Regional Block: Adductor canal block   Pre-Anesthetic Checklist: ,, timeout performed, Correct Patient, Correct Site, Correct Laterality, Correct Procedure, Correct Position, site marked, Risks and benefits discussed, pre-op evaluation,  At surgeon's request and post-op pain management  Laterality: Right  Prep: chloraprep       Needles:   Needle Type: Echogenic Needle     Needle Length: 9cm  Needle Gauge: 21     Additional Needles:   Procedures:,,,, ultrasound used (permanent image in chart),,,,  Narrative:  Start time: 12/28/2016 7:15 AM End time: 12/28/2016 7:20 AM Injection made incrementally with aspirations every 5 mL. Anesthesiologist: Lyndle Herrlich

## 2016-12-28 NOTE — Op Note (Signed)
12/28/2016  9:17 AM  PATIENT:  Joseph Ellison    PRE-OPERATIVE DIAGNOSIS: Right knee primary localized osteoarthritis  POST-OPERATIVE DIAGNOSIS: Right knee primary localized osteoarthritis with large chronic osteochondral defect of the medial femoral condyle  PROCEDURE:  UNICOMPARTMENTAL RIGHT KNEE  SURGEON:  Johnny Bridge, MD  PHYSICIAN ASSISTANT: Joya Gaskins, OPA-C, present and scrubbed throughout the case, critical for completion in a timely fashion, and for retraction, instrumentation, and closure.  ANESTHESIA:   General  ESTIMATED BLOOD LOSS: 75 mL  PREOPERATIVE INDICATIONS:  Joseph Ellison is a  61 y.o. male with a diagnosis of djd right knee who failed conservative measures and elected for surgical management.    The risks benefits and alternatives were discussed with the patient preoperatively including but not limited to the risks of infection, bleeding, nerve injury, cardiopulmonary complications, blood clots, the need for revision surgery, among others, and the patient was willing to proceed.  OPERATIVE IMPLANTS: Biomet [employer] mobile bearing medial compartment arthroplasty femur size extra small, tibia size C, bearing size 4.  OPERATIVE FINDINGS: Endstage grade 4 medial compartment osteoarthritis. No significant changes in the lateral or patellofemoral joint.  The ACL was intact.  Interestingly, the entire lateral half of the medial femoral condyle was a loose fragment of osteochondral bone, which it did not appear to have been connected to the femoral condyle for quite some time.  The deep surface was smooth and.  I was able to position the femoral pegs to be just medial to this defect, such that I had a complete cylindrical containment of the femoral fixation, although the lateral aspect was deficient even after reaming.  The posterior condyle was intact.  OPERATIVE PROCEDURE: The patient was brought to the operating room placed in supine position. General  anesthesia was administered. IV antibiotics were given.  He tolerated Ancef well without complication.  The lower extremity was placed in the legholder and prepped and draped in usual sterile fashion.  Time out was performed.  The leg was elevated and exsanguinated and the tourniquet was inflated. Anteromedial incision was performed, and I took care to preserve the MCL. Parapatellar incision was carried out, and the osteophytes were excised, along with the medial meniscus and a small portion of the fat pad.  The extra medullary tibial cutting jig was applied, using the spoon and the 26mm G-Clamp and the 2 mm shim, and I took care to protect the anterior cruciate ligament insertion and the tibial spine. The medial collateral ligament was also protected, and I resected my proximal tibia, matching the anatomic slope.   The proximal tibial bony cut was removed in one piece, and I turned my attention to the femur.  The intramedullary femoral rod was placed using the drill, and then using the appropriate reference, I assembled the femoral jig, setting my posterior cutting block. I resected my posterior femur, used the 0 spigot for the anterior femur, and then measured my gap.   I then used the appropriate mill to match the extension gap to the flexion gap. The second milling was at a 3.  The gaps were then measured again with the appropriate feeler gauges. Once I had balanced flexion and extension gaps, I then completed the preparation of the femur.  I milled off the anterior aspect of the distal femur to prevent impingement. I also exposed the tibia, and selected the above-named component, and then used the cutting jig to prepare the keel slot on the tibia. I also used the awl to  curette out the bone to complete the preparation of the keel. The back wall was intact.  I then placed trial components, and it was found to have excellent motion, and appropriate balance.  I then cemented the components into  place, cementing the tibia first, removing all excess cement, and then cementing the femur.  All loose cement was removed.  The real polyethylene insert was applied manually, and the knee was taken through functional range of motion, and found to have excellent stability and restoration of joint motion, with excellent balance.  The wounds were irrigated copiously, and the parapatellar tissue closed with Vicryl, followed by Vicryl for the subcutaneous tissue, with routine closure with Steri-Strips and sterile gauze.  The tourniquet was released, and the patient was awakened and extubated and returned to PACU in stable and satisfactory condition. There were no complications.

## 2016-12-28 NOTE — Anesthesia Procedure Notes (Signed)
Spinal  Patient location during procedure: OR Staffing Anesthesiologist: Lyndle Herrlich Spinal Block Patient position: sitting Prep: DuraPrep Patient monitoring: heart rate, blood pressure and continuous pulse ox Approach: right paramedian Location: L3-4 Injection technique: single-shot Needle Needle type: Pencil-Tip  Needle gauge: 24 G Needle length: 12.7 cm Needle insertion depth: 6 cm Assessment Sensory level: T4 Additional Notes Spinal Dosage in OR Spinal thru tuohy w/ 6 cm LOR  .75% Bupivicaine ml       1.9  RLD x 3 min

## 2016-12-28 NOTE — Discharge Instructions (Signed)
INSTRUCTIONS AFTER JOINT REPLACEMENT  ° °o Remove items at home which could result in a fall. This includes throw rugs or furniture in walking pathways °o ICE to the affected joint every three hours while awake for 30 minutes at a time, for at least the first 3-5 days, and then as needed for pain and swelling.  Continue to use ice for pain and swelling. You may notice swelling that will progress down to the foot and ankle.  This is normal after surgery.  Elevate your leg when you are not up walking on it.   °o Continue to use the breathing machine you got in the hospital (incentive spirometer) which will help keep your temperature down.  It is common for your temperature to cycle up and down following surgery, especially at night when you are not up moving around and exerting yourself.  The breathing machine keeps your lungs expanded and your temperature down. ° ° °DIET:  As you were doing prior to hospitalization, we recommend a well-balanced diet. ° °DRESSING / WOUND CARE / SHOWERING ° °You may change your dressing 3-5 days after surgery.  Then change the dressing every day with sterile gauze.  Please use good hand washing techniques before changing the dressing.  Do not use any lotions or creams on the incision until instructed by your surgeon. ° °ACTIVITY ° °o Increase activity slowly as tolerated, but follow the weight bearing instructions below.   °o No driving for 6 weeks or until further direction given by your physician.  You cannot drive while taking narcotics.  °o No lifting or carrying greater than 10 lbs. until further directed by your surgeon. °o Avoid periods of inactivity such as sitting longer than an hour when not asleep. This helps prevent blood clots.  °o You may return to work once you are authorized by your doctor.  ° ° ° °WEIGHT BEARING  ° °Weight bearing as tolerated with assist device (walker, cane, etc) as directed, use it as long as suggested by your surgeon or therapist, typically at  least 4-6 weeks. ° ° °EXERCISES ° °Results after joint replacement surgery are often greatly improved when you follow the exercise, range of motion and muscle strengthening exercises prescribed by your doctor. Safety measures are also important to protect the joint from further injury. Any time any of these exercises cause you to have increased pain or swelling, decrease what you are doing until you are comfortable again and then slowly increase them. If you have problems or questions, call your caregiver or physical therapist for advice.  ° °Rehabilitation is important following a joint replacement. After just a few days of immobilization, the muscles of the leg can become weakened and shrink (atrophy).  These exercises are designed to build up the tone and strength of the thigh and leg muscles and to improve motion. Often times heat used for twenty to thirty minutes before working out will loosen up your tissues and help with improving the range of motion but do not use heat for the first two weeks following surgery (sometimes heat can increase post-operative swelling).  ° °These exercises can be done on a training (exercise) mat, on the floor, on a table or on a bed. Use whatever works the best and is most comfortable for you.    Use music or television while you are exercising so that the exercises are a pleasant break in your day. This will make your life better with the exercises acting as a break   in your routine that you can look forward to.   Perform all exercises about fifteen times, three times per day or as directed.  You should exercise both the operative leg and the other leg as well. ° °Exercises include: °  °• Quad Sets - Tighten up the muscle on the front of the thigh (Quad) and hold for 5-10 seconds.   °• Straight Leg Raises - With your knee straight (if you were given a brace, keep it on), lift the leg to 60 degrees, hold for 3 seconds, and slowly lower the leg.  Perform this exercise against  resistance later as your leg gets stronger.  °• Leg Slides: Lying on your back, slowly slide your foot toward your buttocks, bending your knee up off the floor (only go as far as is comfortable). Then slowly slide your foot back down until your leg is flat on the floor again.  °• Angel Wings: Lying on your back spread your legs to the side as far apart as you can without causing discomfort.  °• Hamstring Strength:  Lying on your back, push your heel against the floor with your leg straight by tightening up the muscles of your buttocks.  Repeat, but this time bend your knee to a comfortable angle, and push your heel against the floor.  You may put a pillow under the heel to make it more comfortable if necessary.  ° °A rehabilitation program following joint replacement surgery can speed recovery and prevent re-injury in the future due to weakened muscles. Contact your doctor or a physical therapist for more information on knee rehabilitation.  ° ° °CONSTIPATION ° °Constipation is defined medically as fewer than three stools per week and severe constipation as less than one stool per week.  Even if you have a regular bowel pattern at home, your normal regimen is likely to be disrupted due to multiple reasons following surgery.  Combination of anesthesia, postoperative narcotics, change in appetite and fluid intake all can affect your bowels.  ° °YOU MUST use at least one of the following options; they are listed in order of increasing strength to get the job done.  They are all available over the counter, and you may need to use some, POSSIBLY even all of these options:   ° °Drink plenty of fluids (prune juice may be helpful) and high fiber foods °Colace 100 mg by mouth twice a day  °Senokot for constipation as directed and as needed Dulcolax (bisacodyl), take with full glass of water  °Miralax (polyethylene glycol) once or twice a day as needed. ° °If you have tried all these things and are unable to have a bowel  movement in the first 3-4 days after surgery call either your surgeon or your primary doctor.   ° °If you experience loose stools or diarrhea, hold the medications until you stool forms back up.  If your symptoms do not get better within 1 week or if they get worse, check with your doctor.  If you experience "the worst abdominal pain ever" or develop nausea or vomiting, please contact the office immediately for further recommendations for treatment. ° ° °ITCHING:  If you experience itching with your medications, try taking only a single pain pill, or even half a pain pill at a time.  You can also use Benadryl over the counter for itching or also to help with sleep.  ° °TED HOSE STOCKINGS:  Use stockings on both legs until for at least 2 weeks or as   directed by physician office. They may be removed at night for sleeping.  MEDICATIONS:  See your medication summary on the After Visit Summary that nursing will review with you.  You may have some home medications which will be placed on hold until you complete the course of blood thinner medication.  It is important for you to complete the blood thinner medication as prescribed.  PRECAUTIONS:  If you experience chest pain or shortness of breath - call 911 immediately for transfer to the hospital emergency department.   If you develop a fever greater that 101 F, purulent drainage from wound, increased redness or drainage from wound, foul odor from the wound/dressing, or calf pain - CONTACT YOUR SURGEON.                                                   FOLLOW-UP APPOINTMENTS:  If you do not already have a post-op appointment, please call the office for an appointment to be seen by your surgeon.  Guidelines for how soon to be seen are listed in your After Visit Summary, but are typically between 1-4 weeks after surgery.    MAKE SURE YOU:   Understand these instructions.   Get help right away if you are not doing well or get worse.    Thank you for  letting us be a part of your medical care team.  It is a privilege we respect greatly.  We hope these instructions will help you stay on track for a fast and full recovery!    Information on my medicine - XARELTO (Rivaroxaban)  This medication education was reviewed with me or my healthcare representative as part of my discharge preparation.  Why was Xarelto prescribed for you? Xarelto was prescribed for you to reduce the risk of blood clots forming after orthopedic surgery. The medical term for these abnormal blood clots is venous thromboembolism (VTE).  What do you need to know about xarelto ? Take your Xarelto ONCE DAILY at the same time every day. You may take it either with or without food.  If you have difficulty swallowing the tablet whole, you may crush it and mix in applesauce just prior to taking your dose.  Take Xarelto exactly as prescribed by your doctor and DO NOT stop taking Xarelto without talking to the doctor who prescribed the medication.  Stopping without other VTE prevention medication to take the place of Xarelto may increase your risk of developing a clot.  After discharge, you should have regular check-up appointments with your healthcare provider that is prescribing your Xarelto.    What do you do if you miss a dose? If you miss a dose, take it as soon as you remember on the same day then continue your regularly scheduled once daily regimen the next day. Do not take two doses of Xarelto on the same day.   Important Safety Information A possible side effect of Xarelto is bleeding. You should call your healthcare provider right away if you experience any of the following: ? Bleeding from an injury or your nose that does not stop. ? Unusual colored urine (red or dark brown) or unusual colored stools (red or black). ? Unusual bruising for unknown reasons. ? A serious fall or if you hit your head (even if there is no bleeding).  Some medicines may interact  with Xarelto and might increase your risk of bleeding while on Xarelto. To help avoid this, consult your healthcare provider or pharmacist prior to using any new prescription or non-prescription medications, including herbals, vitamins, non-steroidal anti-inflammatory drugs (NSAIDs) and supplements.  This website has more information on Xarelto: [email or web address]

## 2016-12-28 NOTE — Progress Notes (Signed)
Report given to caroline rn as caregiver 

## 2016-12-28 NOTE — H&P (Signed)
PREOPERATIVE H&P  Chief Complaint: djd right knee  HPI: Joseph Ellison is a 61 y.o. male who presents for preoperative history and physical with a diagnosis of djd right knee. Symptoms are rated as moderate to severe, and have been worsening.  This is significantly impairing activities of daily living.  He has elected for surgical management.   He has failed injections, activity modification, anti-inflammatories, and assistive devices.  Preoperative X-rays demonstrate end stage degenerative changes with osteophyte formation, loss of joint space, subchondral sclerosis.   Past Medical History:  Diagnosis Date  . Arthritis   . Back pain, chronic   . COPD (chronic obstructive pulmonary disease) ([place])   . Depression   . GERD (gastroesophageal reflux disease)   . Hypertension   . MRSA (methicillin resistant Staphylococcus aureus)   . Neurogenic pain, leg    Past Surgical History:  Procedure Laterality Date  . ELBOW SURGERY    . NOSE SURGERY     Social History   Social History  . Marital status: Legally Separated    Spouse name: N/A  . Number of children: N/A  . Years of education: N/A   Social History Main Topics  . Smoking status: Current Every Day Smoker    Packs/day: 0.25  . Smokeless tobacco: Never Used  . Alcohol use Yes     Comment: socially  . Drug use: No  . Sexual activity: Not Asked   Other Topics Concern  . None   Social History Narrative  . None   History reviewed. No pertinent family history. Allergies  Allergen Reactions  . Penicillins Hives   Prior to Admission medications   Medication Sig Start Date End Date Taking? Authorizing Provider  albuterol (PROVENTIL) (2.5 MG/3ML) 0.083% nebulizer solution Take 2.5 mg by nebulization every 6 (six) hours as needed for wheezing or shortness of breath.   Yes [provider]  cyclobenzaprine (FLEXERIL) 5 MG tablet Take 5 mg by mouth 3 (three) times daily as needed for muscle spasms.   Yes  [provider]  diazepam (VALIUM) 5 MG tablet Take 5 mg by mouth 3 (three) times daily.   Yes [provider]  fluticasone (FLONASE) 50 MCG/ACT nasal spray Place 1 spray into both nostrils daily.   Yes [provider]  gabapentin (NEURONTIN) 300 MG capsule Take 300 mg by mouth 3 (three) times daily. 07/12/13  Yes [provider]  lisinopril (PRINIVIL,ZESTRIL) 20 MG tablet Take 20 mg by mouth daily.  05/07/13  Yes [provider]  lubiprostone (AMITIZA) 24 MCG capsule Take 24 mcg by mouth 2 (two) times daily with a meal.   Yes [provider]  omeprazole (PRILOSEC) 40 MG capsule Take 40 mg by mouth daily. 07/14/13  Yes [provider]  OVER THE COUNTER MEDICATION Take 1 capsule by mouth daily. Negenix Suppliment   Yes [provider]  oxyCODONE-acetaminophen (PERCOCET) 10-325 MG tablet Take 1 tablet by mouth every 4 (four) hours as needed for pain.   Yes [provider]  tamsulosin (FLOMAX) 0.4 MG CAPS capsule Take 0.4 mg by mouth daily.  06/15/13  Yes [provider]  Tetrahydrozoline HCl (VISINE OP) Place 2 drops into both eyes as needed (for dry eyes).   Yes [provider]  traZODone (DESYREL) 100 MG tablet Take 100 mg by mouth at bedtime.  07/12/13  Yes [provider]  venlafaxine XR (EFFEXOR-XR) 150 MG 24 hr capsule Take 150 mg by mouth daily with breakfast.   Yes  [provider]  oxyCODONE-acetaminophen (PERCOCET/ROXICET) 5-325 MG per tablet Take 2 tablets by mouth every 4 (four) hours as needed for severe pain. Patient not taking: Reported on 12/14/2016 07/21/13   Ezequiel Essex, MD     Positive ROS: All other systems have been reviewed and were otherwise negative with the exception of those mentioned in the HPI and as above.  Physical Exam: General: Alert, no acute distress Cardiovascular: No pedal edema Respiratory: No cyanosis, no use of accessory musculature GI: No  organomegaly, abdomen is soft and non-tender Skin: No lesions in the area of chief complaint Neurologic: Sensation intact distally Psychiatric: Patient is competent for consent with normal mood and affect Lymphatic: No axillary or cervical lymphadenopathy  MUSCULOSKELETAL: right knee with varus and pseudolaxity and 0-125 rom with medial crepitance.  Assessment: djd right knee   Plan: Plan for Procedure(s): UNICOMPARTMENTAL RIGHT KNEE  The risks benefits and alternatives were discussed with the patient including but not limited to the risks of nonoperative treatment, versus surgical intervention including infection, bleeding, nerve injury,  blood clots, cardiopulmonary complications, morbidity, mortality, among others, and they were willing to proceed.   Johnny Bridge, MD Cell [phone]   12/28/2016 7:25 AM

## 2016-12-29 ENCOUNTER — Encounter (HOSPITAL_COMMUNITY): Payer: Self-pay | Admitting: Orthopedic Surgery

## 2016-12-29 DIAGNOSIS — M1711 Unilateral primary osteoarthritis, right knee: Secondary | ICD-10-CM | POA: Diagnosis not present

## 2016-12-29 LAB — CBC
HCT: 38.3 % — ABNORMAL LOW (ref 39.0–52.0)
Hemoglobin: 12.4 g/dL — ABNORMAL LOW (ref 13.0–17.0)
MCH: 30.7 pg (ref 26.0–34.0)
MCHC: 32.4 g/dL (ref 30.0–36.0)
MCV: 94.8 fL (ref 78.0–100.0)
Platelets: 270 10*3/uL (ref 150–400)
RBC: 4.04 MIL/uL — ABNORMAL LOW (ref 4.22–5.81)
RDW: 13.7 % (ref 11.5–15.5)
WBC: 13.9 10*3/uL — ABNORMAL HIGH (ref 4.0–10.5)

## 2016-12-29 LAB — BASIC METABOLIC PANEL
Anion gap: 4 — ABNORMAL LOW (ref 5–15)
BUN: 8 mg/dL (ref 6–20)
CALCIUM: 8.7 mg/dL — AB (ref 8.9–10.3)
CO2: 30 mmol/L (ref 22–32)
CREATININE: 0.63 mg/dL (ref 0.61–1.24)
Chloride: 104 mmol/L (ref 101–111)
GFR calc non Af Amer: >60 mL/min (ref >60)
Glucose, Bld: 144 mg/dL — ABNORMAL HIGH (ref 65–99)
Potassium: 4 mmol/L (ref 3.5–5.1)
SODIUM: 138 mmol/L (ref 135–145)

## 2016-12-29 NOTE — Progress Notes (Signed)
Physician Discharge Summary  Patient ID: Joseph Ellison MRN: [id number] DOB/AGE: October 21, 1955 61 y.o.  Admit date: 12/28/2016 Discharge date: 12/29/2016  Admission Diagnoses:  Primary localized osteoarthritis of right knee  Discharge Diagnoses:  Principal Problem:   Primary localized osteoarthritis of right knee Active Problems:   S/P knee replacement   Past Medical History:  Diagnosis Date  . Arthritis   . Back pain, chronic   . COPD (chronic obstructive pulmonary disease) ([place])   . Depression   . GERD (gastroesophageal reflux disease)   . Hypertension   . MRSA (methicillin resistant Staphylococcus aureus)   . Neurogenic pain, leg   . Primary localized osteoarthritis of right knee 12/28/2016    Surgeries: Procedure(s): UNICOMPARTMENTAL RIGHT KNEE on 12/28/2016   Consultants (if any):   Discharged Condition: Improved  Hospital Course: Joseph Ellison is an 61 y.o. male who was admitted 12/28/2016 with a diagnosis of Primary localized osteoarthritis of right knee and went to the operating room on 12/28/2016 and underwent the above named procedures.    He was given perioperative antibiotics:  Anti-infectives    Start     Dose/Rate Route Frequency Ordered Stop   12/28/16 1330  ceFAZolin (ANCEF) IVPB 2g/100 mL premix     2 g 200 mL/hr over 30 Minutes Intravenous Every 6 hours 12/28/16 1134 12/28/16 1900   12/28/16 0543  ceFAZolin (ANCEF) IVPB 2g/100 mL premix    Comments:  Okay to do a test dose   2 g 200 mL/hr over 30 Minutes Intravenous On call to O.R. 12/28/16 4008 12/28/16 6761    .  He was given sequential compression devices, early ambulation, and Xarelto for DVT prophylaxis.  He benefited maximally from the hospital stay and there were no complications.  He lost his IV on the evening of surgery, and had difficulty with pain control, but was reasonably doing well and feels capable of ambulating and plan to be discharged home.  Recent vital signs:   Vitals:   12/28/16 2353 12/29/16 0400  BP: (!) 166/78 (!) 141/69  Pulse: (!) 59 (!) 57  Resp: 20 18  Temp: 98.7 F (37.1 C) 97.7 F (36.5 C)  SpO2: 95% 97%    Recent laboratory studies:  Lab Results  Component Value Date   HGB 12.4 (L) 12/29/2016   HGB 13.4 12/20/2016   HGB 14.1 07/21/2013   Lab Results  Component Value Date   WBC 13.9 (H) 12/29/2016   PLT 270 12/29/2016   Lab Results  Component Value Date   INR 1.11 06/20/2010   Lab Results  Component Value Date   NA 138 12/29/2016   K 4.0 12/29/2016   CL 104 12/29/2016   CO2 30 12/29/2016   BUN 8 12/29/2016   CREATININE 0.63 12/29/2016   GLUCOSE 144 (H) 12/29/2016    Discharge Medications:   Allergies as of 12/29/2016      Reactions   Penicillins Hives   Ok with Ancef      Medication List    TAKE these medications   albuterol (2.5 MG/3ML) 0.083% nebulizer solution Commonly known as:  PROVENTIL Take 2.5 mg by nebulization every 6 (six) hours as needed for wheezing or shortness of breath.   cyclobenzaprine 5 MG tablet Commonly known as:  FLEXERIL Take 1 tablet (5 mg total) by mouth 3 (three) times daily as needed for muscle spasms.   diazepam 5 MG tablet Commonly known as:  VALIUM Take 1 tablet (5 mg total) by mouth 3 (three) times  daily.   fluticasone 50 MCG/ACT nasal spray Commonly known as:  FLONASE Place 1 spray into both nostrils daily.   gabapentin 300 MG capsule Commonly known as:  NEURONTIN Take 300 mg by mouth 3 (three) times daily.   HYDROmorphone 2 MG tablet Commonly known as:  DILAUDID Take 1 tablet (2 mg total) by mouth every 4 (four) hours as needed for severe pain.   lisinopril 20 MG tablet Commonly known as:  PRINIVIL,ZESTRIL Take 20 mg by mouth daily.   lubiprostone 24 MCG capsule Commonly known as:  AMITIZA Take 24 mcg by mouth 2 (two) times daily with a meal.   omeprazole 40 MG capsule Commonly known as:  PRILOSEC Take 40 mg by mouth daily.   ondansetron 4 MG  tablet Commonly known as:  ZOFRAN Take 1 tablet (4 mg total) by mouth every 8 (eight) hours as needed for nausea or vomiting.   OVER THE COUNTER MEDICATION Take 1 capsule by mouth daily. Negenix Suppliment   oxyCODONE-acetaminophen 10-325 MG tablet Commonly known as:  PERCOCET Take 1 tablet by mouth every 4 (four) hours as needed for pain. What changed:  Another medication with the same name was removed. Continue taking this medication, and follow the directions you see here.   rivaroxaban 10 MG Tabs tablet Commonly known as:  XARELTO Take 1 tablet (10 mg total) by mouth daily.   sennosides-docusate sodium 8.6-50 MG tablet Commonly known as:  SENOKOT-S Take 2 tablets by mouth daily.   tamsulosin 0.4 MG Caps capsule Commonly known as:  FLOMAX Take 0.4 mg by mouth daily.   traZODone 100 MG tablet Commonly known as:  DESYREL Take 100 mg by mouth at bedtime.   venlafaxine XR 150 MG 24 hr capsule Commonly known as:  EFFEXOR-XR Take 150 mg by mouth daily with breakfast.   VISINE OP Place 2 drops into both eyes as needed (for dry eyes).       Diagnostic Studies: Dg Knee 1-2 Views Right  Result Date: 12/28/2016 CLINICAL DATA:  Status post right unicompartmental knee replacement EXAM: RIGHT KNEE - 1-2 VIEW COMPARISON:  05/17/2014 FINDINGS: Medial unicompartmental knee arthroplasty. The joint is located. There is expected soft tissue and joint gas with joint effusion. Stable positioning of intraarticular body in the posterior joint line. IMPRESSION: No acute finding after unicompartmental arthroplasty. Electronically Signed   By: Monte Fantasia M.D.   On: 12/28/2016 13:10    Disposition: 01-Home or Self Care    Follow-up Information    Marchia Bond, MD. Schedule an appointment as soon as possible for a visit in 2 weeks.   Specialty:  Orthopedic Surgery Contact information: [place] [id number] [phone]             Signed: Johnny Bridge 12/29/2016, 6:54 AM

## 2016-12-29 NOTE — Progress Notes (Signed)
Pt observed that he lost his IV access by accident as plastic needle found sticking out of the skin as saline flushed by RN oozed out to his gown. Needle intact and site appears clean and dry. Will continue to monitor.

## 2016-12-29 NOTE — Care Management Obs Status (Signed)
MEDICARE OBSERVATION STATUS NOTIFICATION   Patient Details  Name: ZYMARION FAVORITE MRN: [id number] Date of Birth: Aug 09, 1955   Medicare Observation Status Notification Given:  Yes    Ninfa Meeker, RN 12/29/2016, 8:43 AM

## 2016-12-29 NOTE — Evaluation (Signed)
Occupational Therapy Evaluation and Discharge Patient Details Name: Joseph Ellison MRN: [id number] DOB: 01-09-1956 Today's Date: 12/29/2016    History of Present Illness Pt is a 61 y/o male s/p R unicompartmental knee replacement. PMH includes HTN, depression, MRSA, COPD, chronic back pain, and smoker.   Clinical Impression   Pt reports he was independent with ADL PTA. Currently pt overall min guard for functional mobility and supervision for ADL. All knee, safety, and ADL education completed with pt. Pt planning to d/c home with 24/7 supervision from family. No further acute OT needs identified; signing off at this time. Please re-consult if needs change. Thank you for this referral.    Follow Up Recommendations  No OT follow up;Supervision - Intermittent    Equipment Recommendations  3 in 1 bedside commode    Recommendations for Other Services       Precautions / Restrictions Precautions Precautions: Knee Precaution Booklet Issued: No Restrictions Weight Bearing Restrictions: Yes RLE Weight Bearing: Weight bearing as tolerated      Mobility Bed Mobility               General bed mobility comments: Pt OOB in chair upon arrival  Transfers Overall transfer level: Needs assistance Equipment used: Rolling walker (2 wheeled) Transfers: Sit to/from Stand Sit to Stand: Supervision         General transfer comment: For safety. Good hand placement and technique; not using RW for transfer    Balance Overall balance assessment: Needs assistance Sitting-balance support: Feet supported;No upper extremity supported Sitting balance-Leahy Scale: Good     Standing balance support: No upper extremity supported;During functional activity Standing balance-Leahy Scale: Fair                             ADL either performed or assessed with clinical judgement   ADL Overall ADL's : Needs assistance/impaired Eating/Feeding: Independent;Sitting   Grooming:  Supervision/safety;Standing   Upper Body Bathing: Set up;Sitting   Lower Body Bathing: Supervison/ safety;Sit to/from stand   Upper Body Dressing : Set up;Sitting   Lower Body Dressing: Supervision/safety;Sit to/from stand Lower Body Dressing Details (indicate cue type and reason): Pt able to reach down to R foot. Educated on compensatory strategies for LB ADL. Toilet Transfer: Min guard;Ambulation;BSC Toilet Transfer Details (indicate cue type and reason): Simulated by sit to stand from chair with functional mobility. Educated on use of 3 in 1 over toilet     Tub/ Shower Transfer: Min guard;Walk-in shower;Ambulation;3 in 1 Tub/Shower Transfer Details (indicate cue type and reason): Educated on use of 3 in 1 in shower as a seat Functional mobility during ADLs: Min guard;Rolling walker (and without RW)       Vision         Perception     Praxis      Pertinent Vitals/Pain Pain Assessment: Faces Faces Pain Scale: Hurts little more Pain Location: R knee Pain Descriptors / Indicators: Sore;Discomfort Pain Intervention(s): Monitored during session     Hand Dominance     Extremity/Trunk Assessment Upper Extremity Assessment Upper Extremity Assessment: Overall WFL for tasks assessed   Lower Extremity Assessment Lower Extremity Assessment: Defer to PT evaluation   Cervical / Trunk Assessment Cervical / Trunk Assessment: Normal (chronic back pain)   Communication Communication Communication: No difficulties   Cognition Arousal/Alertness: Awake/alert Behavior During Therapy: WFL for tasks assessed/performed Overall Cognitive Status: Within Functional Limits for tasks assessed  General Comments       Exercises     Shoulder Instructions      Home Living Family/patient expects to be discharged to:: Private residence Living Arrangements: Spouse/significant other (planning to stay with girlfriend) Available Help at  Discharge: Family;Available 24 hours/day Type of Home: House Home Access: Stairs to enter [employer] of Steps: 1 Entrance Stairs-Rails: None Home Layout: One level     Bathroom Shower/Tub: Walk-in shower (at his house, tub at girlfriends)   Biochemist, clinical: Standard Bathroom Accessibility: No   Home Equipment: Radio producer - single point          Prior Functioning/Environment Level of Independence: Independent with assistive device(s)        Comments: Was using cane for ambulation         OT Problem List:        OT Treatment/Interventions:      OT Goals(Current goals can be found in the care plan section) Acute Rehab OT Goals Patient Stated Goal: go home OT Goal Formulation: All assessment and education complete, DC therapy  OT Frequency:     Barriers to D/C:            Co-evaluation              AM-PAC PT "6 Clicks" Daily Activity     Outcome Measure Help from another person eating meals?: None Help from another person taking care of personal grooming?: A Little Help from another person toileting, which includes using toliet, bedpan, or urinal?: A Little Help from another person bathing (including washing, rinsing, drying)?: A Little Help from another person to put on and taking off regular upper body clothing?: None Help from another person to put on and taking off regular lower body clothing?: A Little 6 Click Score: 20   End of Session Equipment Utilized During Treatment: Rolling walker Nurse Communication: Mobility status;Other (comment) (equipment and f/u needs)  Activity Tolerance: Patient tolerated treatment well Patient left: in chair;with call bell/phone within reach  OT Visit Diagnosis: Unsteadiness on feet (R26.81);Other abnormalities of gait and mobility (R26.89)                Time: 7972-8206 OT Time Calculation (min): 16 min Charges:  OT General Charges $OT Visit: 1 Visit OT Evaluation $OT Eval Moderate Complexity: 1  Mod G-Codes: OT G-codes **NOT FOR INPATIENT CLASS** Functional Assessment Tool Used: Clinical judgement Functional Limitation: Self care Self Care Current Status (O1561): At least 1 percent but less than 20 percent impaired, limited or restricted Self Care Goal Status (B3794): At least 1 percent but less than 20 percent impaired, limited or restricted Self Care Discharge Status [phone]): At least 1 percent but less than 20 percent impaired, limited or restricted   Mel Almond A. Ulice Brilliant, M.S., OTR/L Pager: [place] 12/29/2016, 8:48 AM

## 2016-12-29 NOTE — Care Management (Signed)
61 yr old gentleman s/p right unicompartmental knee arthroplasty. Case manager spoke with patient concerning discharge plan. He will have support of girlfriend and his daughter, will go to outpatient therapy. RW and 3in1 have been ordered for patient.

## 2016-12-29 NOTE — Progress Notes (Signed)
Patient alert and oriented, mae's well, voiding adequate amount of urine, swallowing without difficulty, c/o pain and medication given prior to discharged. Patient discharged home with family. Script and discharged instructions given to patient. Patient and family stated understanding of d/c instructions given and has an appointment with MD.  

## 2016-12-29 NOTE — Progress Notes (Signed)
Physical Therapy Treatment Patient Details Name: Joseph Ellison MRN: [id number] DOB: Mar 28, 1955 Today's Date: 12/29/2016    History of Present Illness Pt is a 61 y/o male s/p R unicompartmental knee replacement. PMH includes HTN, depression, MRSA, COPD, chronic back pain, and smoker.    PT Comments    Pt progressing towards physical therapy goals. Focus of session was gait training. Pt was able to progress to no AD by end of session. 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 Feel this is reasonable to use the RW outside, however educated pt on general safety with maneuvering the walker over uneven terrain. Pt was able to tolerate some therapeutic exercise with minimal reports of increased pain, and the rest of pt's HEP was verbally reviewed. Will continue to follow and progress as able per POC.   Follow Up Recommendations  DC plan and follow up therapy as arranged by surgeon;Supervision for mobility/OOB     Equipment Recommendations  Rolling walker with 5" wheels;3in1 (PT)    Recommendations for Other Services       Precautions / Restrictions Precautions Precautions: Knee Precaution Booklet Issued: No Precaution Comments: Pt was educated on propping up ankle on towel roll to promote extension however NO pillow/roll/ice pack is to be placed under knee.  Restrictions Weight Bearing Restrictions: Yes RLE Weight Bearing: Weight bearing as tolerated    Mobility  Bed Mobility Overal bed mobility: Needs Assistance Bed Mobility: Supine to Sit     Supine to sit: Supervision     General bed mobility comments: No assist required. HOB flat and rails lowered to simulate home environment.   Transfers Overall transfer level: Needs assistance Equipment used: Rolling walker (2 wheeled);None Transfers: Sit to/from Stand Sit to Stand: Supervision         General transfer comment: Initially with RW, and progressed to no AD. VC's  for hand placement on seated surface for safety.   Ambulation/Gait Ambulation/Gait assistance: Min guard;Supervision Ambulation Distance (Feet): 400 Feet Assistive device: Rolling walker (2 wheeled);None Gait Pattern/deviations: Step-through pattern;Decreased stride length;Trunk flexed Gait velocity: Decreased Gait velocity interpretation: Below normal speed for age/gender General Gait Details: Initially pt requiring close guard with RW. Progressed to no AD in room. Attempted SPC for support however pt with difficulty sequencing and was grossly antalgic with the cane. Overall, did better with no AD.    Stairs Stairs:  (Pt declined practicing 1 stair)          Wheelchair Mobility    Modified Rankin (Stroke Patients Only)       Balance Overall balance assessment: Needs assistance Sitting-balance support: Feet supported;No upper extremity supported Sitting balance-Leahy Scale: Good     Standing balance support: No upper extremity supported;During functional activity Standing balance-Leahy Scale: Fair                              Cognition Arousal/Alertness: Awake/alert Behavior During Therapy: WFL for tasks assessed/performed Overall Cognitive Status: Within Functional Limits for tasks assessed                                        Exercises Total Joint Exercises Heel Slides: Seated;5 reps (stretch for ROM) Long Arc Quad: 10 reps Goniometric ROM: 6-103 AROM in sitting    General Comments  Pertinent Vitals/Pain Pain Assessment: Faces Faces Pain Scale: Hurts little more Pain Location: R knee Pain Descriptors / Indicators: Sore;Discomfort Pain Intervention(s): Monitored during session    Home Living Family/patient expects to be discharged to:: Private residence Living Arrangements: Spouse/significant other (planning to stay with girlfriend) Available Help at Discharge: Family;Available 24 hours/day Type of Home:  House Home Access: Stairs to enter Entrance Stairs-Rails: None Home Layout: One level Home Equipment: Cane - single point      Prior Function Level of Independence: Independent with assistive device(s)      Comments: Was using cane for ambulation    PT Goals (current goals can now be found in the care plan section) Acute Rehab PT Goals Patient Stated Goal: go home today PT Goal Formulation: With patient Time For Goal Achievement: 01/04/17 Potential to Achieve Goals: Good Progress towards PT goals: Progressing toward goals    Frequency    7X/week      PT Plan Current plan remains appropriate    Co-evaluation              AM-PAC PT "6 Clicks" Daily Activity  Outcome Measure  Difficulty turning over in bed (including adjusting bedclothes, sheets and blankets)?: None Difficulty moving from lying on back to sitting on the side of the bed? : None Difficulty sitting down on and standing up from a chair with arms (e.g., wheelchair, bedside commode, etc,.)?: A Little Help needed moving to and from a bed to chair (including a wheelchair)?: A Little Help needed walking in hospital room?: A Little Help needed climbing 3-5 steps with a railing? : A Little 6 Click Score: 20    End of Session Equipment Utilized During Treatment: Gait belt Activity Tolerance: Patient tolerated treatment well Patient left: in chair;with call bell/phone within reach Nurse Communication: Mobility status PT Visit Diagnosis: Other abnormalities of gait and mobility (R26.89);Pain Pain - Right/Left: Right Pain - part of body: Knee     Time: 0742-0808 PT Time Calculation (min) (ACUTE ONLY): 26 min  Charges:  $Gait Training: 8-22 mins $Therapeutic Exercise: 8-22 mins                    G Codes:       Rolinda Roan, PT, DPT Acute Rehabilitation Services Pager: [phone]    Thelma Comp 12/29/2016, 9:49 AM

## 2016-12-30 NOTE — Discharge Summary (Signed)
Marchia Bond, MD Physician Signed Orthopedics  Progress Notes Date of Service: 12/29/2016 6:53 AM    Expand All Collapse All   [] Hide copied text [] Hover for attribution information Physician Discharge Summary  Patient ID: Joseph Ellison MRN: [id number] DOB/AGE: [birth year] 61 y.o.  Admit date: 12/28/2016 Discharge date: 12/29/2016  Admission Diagnoses:  Primary localized osteoarthritis of right knee  Discharge Diagnoses:  Principal Problem:   Primary localized osteoarthritis of right knee Active Problems:   S/P knee replacement       Past Medical History:  Diagnosis Date  . Arthritis   . Back pain, chronic   . COPD (chronic obstructive pulmonary disease) ([place])   . Depression   . GERD (gastroesophageal reflux disease)   . Hypertension   . MRSA (methicillin resistant Staphylococcus aureus)   . Neurogenic pain, leg   . Primary localized osteoarthritis of right knee 12/28/2016    Surgeries: Procedure(s): UNICOMPARTMENTAL RIGHT KNEE on 12/28/2016   Consultants (if any):   Discharged Condition: Improved  Hospital Course: [name] is an 61 y.o. male who was admitted 12/28/2016 with a diagnosis of Primary localized osteoarthritis of right knee and went to the operating room on 12/28/2016 and underwent the above named procedures.    He was given perioperative antibiotics:            Anti-infectives    Start     Dose/Rate Route Frequency Ordered Stop   12/28/16 1330  ceFAZolin (ANCEF) IVPB 2g/100 mL premix     2 g 200 mL/hr over 30 Minutes Intravenous Every 6 hours 12/28/16 1134 12/28/16 1900   12/28/16 0543  ceFAZolin (ANCEF) IVPB 2g/100 mL premix    Comments:  Okay to do a test dose   2 g 200 mL/hr over 30 Minutes Intravenous On call to O.R. 12/28/16 0865 12/28/16 7846    .  He was given sequential compression devices, early ambulation, and Xarelto for DVT prophylaxis.  He benefited maximally from the hospital stay and  there were no complications.  He lost his IV on the evening of surgery, and had difficulty with pain control, but was reasonably doing well and feels capable of ambulating and plan to be discharged home.  Recent vital signs:      Vitals:   12/28/16 2353 12/29/16 0400  BP: (!) 166/78 (!) 141/69  Pulse: (!) 59 (!) 57  Resp: 20 18  Temp: 98.7 F (37.1 C) 97.7 F (36.5 C)  SpO2: 95% 97%    Recent laboratory studies:  Recent Labs       Lab Results  Component Value Date   HGB 12.4 (L) 12/29/2016   HGB 13.4 12/20/2016   HGB 14.1 07/21/2013     Recent Labs       Lab Results  Component Value Date   WBC 13.9 (H) 12/29/2016   PLT 270 12/29/2016     Recent Labs       Lab Results  Component Value Date   INR 1.11 06/20/2010     Recent Labs       Lab Results  Component Value Date   NA 138 12/29/2016   K 4.0 12/29/2016   CL 104 12/29/2016   CO2 30 12/29/2016   BUN 8 12/29/2016   CREATININE 0.63 12/29/2016   GLUCOSE 144 (H) 12/29/2016      Discharge Medications:       Allergies as of 12/29/2016      Reactions   Penicillins Hives   Ok with Ancef  Medication List     TAKE these medications   albuterol (2.5 MG/3ML) 0.083% nebulizer solution Commonly known as:  PROVENTIL Take 2.5 mg by nebulization every 6 (six) hours as needed for wheezing or shortness of breath.   cyclobenzaprine 5 MG tablet Commonly known as:  FLEXERIL Take 1 tablet (5 mg total) by mouth 3 (three) times daily as needed for muscle spasms.   diazepam 5 MG tablet Commonly known as:  VALIUM Take 1 tablet (5 mg total) by mouth 3 (three) times daily.   fluticasone 50 MCG/ACT nasal spray Commonly known as:  FLONASE Place 1 spray into both nostrils daily.   gabapentin 300 MG capsule Commonly known as:  NEURONTIN Take 300 mg by mouth 3 (three) times daily.   HYDROmorphone 2 MG tablet Commonly known as:  DILAUDID Take 1 tablet (2 mg total) by  mouth every 4 (four) hours as needed for severe pain.   lisinopril 20 MG tablet Commonly known as:  PRINIVIL,ZESTRIL Take 20 mg by mouth daily.   lubiprostone 24 MCG capsule Commonly known as:  AMITIZA Take 24 mcg by mouth 2 (two) times daily with a meal.   omeprazole 40 MG capsule Commonly known as:  PRILOSEC Take 40 mg by mouth daily.   ondansetron 4 MG tablet Commonly known as:  ZOFRAN Take 1 tablet (4 mg total) by mouth every 8 (eight) hours as needed for nausea or vomiting.   OVER THE COUNTER MEDICATION Take 1 capsule by mouth daily. Negenix Suppliment   oxyCODONE-acetaminophen 10-325 MG tablet Commonly known as:  PERCOCET Take 1 tablet by mouth every 4 (four) hours as needed for pain. What changed:  Another medication with the same name was removed. Continue taking this medication, and follow the directions you see here.   rivaroxaban 10 MG Tabs tablet Commonly known as:  XARELTO Take 1 tablet (10 mg total) by mouth daily.   sennosides-docusate sodium 8.6-50 MG tablet Commonly known as:  SENOKOT-S Take 2 tablets by mouth daily.   tamsulosin 0.4 MG Caps capsule Commonly known as:  FLOMAX Take 0.4 mg by mouth daily.   traZODone 100 MG tablet Commonly known as:  DESYREL Take 100 mg by mouth at bedtime.   venlafaxine XR 150 MG 24 hr capsule Commonly known as:  EFFEXOR-XR Take 150 mg by mouth daily with breakfast.   VISINE OP Place 2 drops into both eyes as needed (for dry eyes).       Diagnostic Studies:  Imaging Results  Dg Knee 1-2 Views Right  Result Date: 12/28/2016 CLINICAL DATA:  Status post right unicompartmental knee replacement EXAM: RIGHT KNEE - 1-2 VIEW COMPARISON:  05/17/2014 FINDINGS: Medial unicompartmental knee arthroplasty. The joint is located. There is expected soft tissue and joint gas with joint effusion. Stable positioning of intraarticular body in the posterior joint line. IMPRESSION: No acute finding after  unicompartmental arthroplasty. Electronically Signed   By: Monte Fantasia M.D.   On: 12/28/2016 13:10     Disposition: 01-Home or Self Care      Follow-up Information    Marchia Bond, MD. Schedule an appointment as soon as possible for a visit in 2 weeks.   Specialty:  Orthopedic Surgery Contact information: [place] [address] [phone]            Signed: Johnny Bridge 12/29/2016, 6:54 AM

## 2016-12-31 NOTE — Anesthesia Postprocedure Evaluation (Signed)
Anesthesia Post Note  Patient: Joseph Ellison  Procedure(s) Performed: UNICOMPARTMENTAL RIGHT KNEE (Right Knee)     Patient location during evaluation: PACU Anesthesia Type: Spinal Level of consciousness: awake and alert Pain management: pain level controlled Vital Signs Assessment: post-procedure vital signs reviewed and stable Respiratory status: spontaneous breathing, nonlabored ventilation, respiratory function stable and patient connected to nasal cannula oxygen Cardiovascular status: blood pressure returned to baseline and stable Postop Assessment: no apparent nausea or vomiting and spinal receding Anesthetic complications: no    Last Vitals:  Vitals:   12/29/16 0826 12/29/16 1216  BP: (!) 139/91 122/72  Pulse: 61 60  Resp: 18 18  Temp: 36.6 C 36.9 C  SpO2: 98% 96%    Last Pain:  Vitals:   12/29/16 1303  TempSrc:   PainSc: 7    Pain Goal: Patients Stated Pain Goal: 4 (12/29/16 1303)               Lyndle Herrlich EDWARD

## 2017-01-10 DIAGNOSIS — Z96651 Presence of right artificial knee joint: Secondary | ICD-10-CM | POA: Diagnosis not present

## 2017-01-26 DIAGNOSIS — Z96651 Presence of right artificial knee joint: Secondary | ICD-10-CM | POA: Diagnosis not present

## 2017-02-07 DIAGNOSIS — M461 Sacroiliitis, not elsewhere classified: Secondary | ICD-10-CM | POA: Diagnosis not present

## 2017-02-07 DIAGNOSIS — G894 Chronic pain syndrome: Secondary | ICD-10-CM | POA: Diagnosis not present

## 2017-02-07 DIAGNOSIS — M5417 Radiculopathy, lumbosacral region: Secondary | ICD-10-CM | POA: Diagnosis not present

## 2017-02-07 DIAGNOSIS — Z96651 Presence of right artificial knee joint: Secondary | ICD-10-CM | POA: Diagnosis not present

## 2017-02-07 DIAGNOSIS — M47817 Spondylosis without myelopathy or radiculopathy, lumbosacral region: Secondary | ICD-10-CM | POA: Diagnosis not present

## 2017-02-24 DIAGNOSIS — J449 Chronic obstructive pulmonary disease, unspecified: Secondary | ICD-10-CM | POA: Diagnosis not present

## 2017-02-24 DIAGNOSIS — K219 Gastro-esophageal reflux disease without esophagitis: Secondary | ICD-10-CM | POA: Diagnosis not present

## 2017-02-24 DIAGNOSIS — Z79899 Other long term (current) drug therapy: Secondary | ICD-10-CM | POA: Diagnosis not present

## 2017-02-24 DIAGNOSIS — Z760 Encounter for issue of repeat prescription: Secondary | ICD-10-CM | POA: Diagnosis not present

## 2017-02-24 DIAGNOSIS — I10 Essential (primary) hypertension: Secondary | ICD-10-CM | POA: Diagnosis not present

## 2017-02-24 DIAGNOSIS — R1084 Generalized abdominal pain: Secondary | ICD-10-CM | POA: Diagnosis not present

## 2017-03-07 DIAGNOSIS — J441 Chronic obstructive pulmonary disease with (acute) exacerbation: Secondary | ICD-10-CM | POA: Diagnosis not present

## 2017-03-07 DIAGNOSIS — K210 Gastro-esophageal reflux disease with esophagitis: Secondary | ICD-10-CM | POA: Diagnosis not present

## 2017-03-07 DIAGNOSIS — I10 Essential (primary) hypertension: Secondary | ICD-10-CM | POA: Diagnosis not present

## 2017-03-07 DIAGNOSIS — N401 Enlarged prostate with lower urinary tract symptoms: Secondary | ICD-10-CM | POA: Diagnosis not present

## 2017-03-07 DIAGNOSIS — J449 Chronic obstructive pulmonary disease, unspecified: Secondary | ICD-10-CM | POA: Diagnosis not present

## 2017-03-10 DIAGNOSIS — M461 Sacroiliitis, not elsewhere classified: Secondary | ICD-10-CM | POA: Diagnosis not present

## 2017-03-10 DIAGNOSIS — G894 Chronic pain syndrome: Secondary | ICD-10-CM | POA: Diagnosis not present

## 2017-03-10 DIAGNOSIS — M5417 Radiculopathy, lumbosacral region: Secondary | ICD-10-CM | POA: Diagnosis not present

## 2017-03-10 DIAGNOSIS — M47817 Spondylosis without myelopathy or radiculopathy, lumbosacral region: Secondary | ICD-10-CM | POA: Diagnosis not present

## 2017-04-08 DIAGNOSIS — G894 Chronic pain syndrome: Secondary | ICD-10-CM | POA: Diagnosis not present

## 2017-04-08 DIAGNOSIS — M5417 Radiculopathy, lumbosacral region: Secondary | ICD-10-CM | POA: Diagnosis not present

## 2017-04-08 DIAGNOSIS — M461 Sacroiliitis, not elsewhere classified: Secondary | ICD-10-CM | POA: Diagnosis not present

## 2017-04-08 DIAGNOSIS — M47817 Spondylosis without myelopathy or radiculopathy, lumbosacral region: Secondary | ICD-10-CM | POA: Diagnosis not present

## 2017-05-24 DIAGNOSIS — K210 Gastro-esophageal reflux disease with esophagitis: Secondary | ICD-10-CM | POA: Diagnosis not present

## 2017-05-24 DIAGNOSIS — N401 Enlarged prostate with lower urinary tract symptoms: Secondary | ICD-10-CM | POA: Diagnosis not present

## 2017-05-24 DIAGNOSIS — J441 Chronic obstructive pulmonary disease with (acute) exacerbation: Secondary | ICD-10-CM | POA: Diagnosis not present

## 2017-05-24 DIAGNOSIS — I10 Essential (primary) hypertension: Secondary | ICD-10-CM | POA: Diagnosis not present

## 2017-05-24 DIAGNOSIS — Z0000 Encounter for general adult medical examination without abnormal findings: Secondary | ICD-10-CM | POA: Diagnosis not present

## 2017-06-06 DIAGNOSIS — M461 Sacroiliitis, not elsewhere classified: Secondary | ICD-10-CM | POA: Diagnosis not present

## 2017-06-06 DIAGNOSIS — M5417 Radiculopathy, lumbosacral region: Secondary | ICD-10-CM | POA: Diagnosis not present

## 2017-06-06 DIAGNOSIS — M47817 Spondylosis without myelopathy or radiculopathy, lumbosacral region: Secondary | ICD-10-CM | POA: Diagnosis not present

## 2017-06-06 DIAGNOSIS — G894 Chronic pain syndrome: Secondary | ICD-10-CM | POA: Diagnosis not present

## 2017-08-03 DIAGNOSIS — Z79891 Long term (current) use of opiate analgesic: Secondary | ICD-10-CM | POA: Diagnosis not present

## 2017-08-03 DIAGNOSIS — M5417 Radiculopathy, lumbosacral region: Secondary | ICD-10-CM | POA: Diagnosis not present

## 2017-08-03 DIAGNOSIS — M47817 Spondylosis without myelopathy or radiculopathy, lumbosacral region: Secondary | ICD-10-CM | POA: Diagnosis not present

## 2017-08-03 DIAGNOSIS — M461 Sacroiliitis, not elsewhere classified: Secondary | ICD-10-CM | POA: Diagnosis not present

## 2017-08-03 DIAGNOSIS — G894 Chronic pain syndrome: Secondary | ICD-10-CM | POA: Diagnosis not present

## 2017-08-24 DIAGNOSIS — K210 Gastro-esophageal reflux disease with esophagitis: Secondary | ICD-10-CM | POA: Diagnosis not present

## 2017-08-24 DIAGNOSIS — I10 Essential (primary) hypertension: Secondary | ICD-10-CM | POA: Diagnosis not present

## 2017-08-24 DIAGNOSIS — N401 Enlarged prostate with lower urinary tract symptoms: Secondary | ICD-10-CM | POA: Diagnosis not present

## 2017-08-24 DIAGNOSIS — J441 Chronic obstructive pulmonary disease with (acute) exacerbation: Secondary | ICD-10-CM | POA: Diagnosis not present

## 2017-09-27 ENCOUNTER — Encounter (HOSPITAL_COMMUNITY): Payer: Self-pay | Admitting: Emergency Medicine

## 2017-09-27 ENCOUNTER — Other Ambulatory Visit: Payer: Self-pay

## 2017-09-27 ENCOUNTER — Emergency Department (HOSPITAL_COMMUNITY)
Admission: EM | Admit: 2017-09-27 | Discharge: 2017-09-27 | Disposition: A | Discharge status: Home / Self Care | Payer: Medicare Other | Attending: Emergency Medicine | Admitting: Emergency Medicine

## 2017-09-27 DIAGNOSIS — M461 Sacroiliitis, not elsewhere classified: Secondary | ICD-10-CM | POA: Diagnosis not present

## 2017-09-27 DIAGNOSIS — J449 Chronic obstructive pulmonary disease, unspecified: Secondary | ICD-10-CM | POA: Diagnosis not present

## 2017-09-27 DIAGNOSIS — Z96651 Presence of right artificial knee joint: Secondary | ICD-10-CM | POA: Diagnosis not present

## 2017-09-27 DIAGNOSIS — I959 Hypotension, unspecified: Secondary | ICD-10-CM

## 2017-09-27 DIAGNOSIS — M47812 Spondylosis without myelopathy or radiculopathy, cervical region: Secondary | ICD-10-CM | POA: Diagnosis not present

## 2017-09-27 DIAGNOSIS — M47817 Spondylosis without myelopathy or radiculopathy, lumbosacral region: Secondary | ICD-10-CM | POA: Diagnosis not present

## 2017-09-27 DIAGNOSIS — Z79899 Other long term (current) drug therapy: Secondary | ICD-10-CM | POA: Insufficient documentation

## 2017-09-27 DIAGNOSIS — I10 Essential (primary) hypertension: Secondary | ICD-10-CM | POA: Insufficient documentation

## 2017-09-27 DIAGNOSIS — Z7901 Long term (current) use of anticoagulants: Secondary | ICD-10-CM | POA: Diagnosis not present

## 2017-09-27 DIAGNOSIS — F17200 Nicotine dependence, unspecified, uncomplicated: Secondary | ICD-10-CM | POA: Insufficient documentation

## 2017-09-27 DIAGNOSIS — R42 Dizziness and giddiness: Secondary | ICD-10-CM | POA: Diagnosis present

## 2017-09-27 DIAGNOSIS — G894 Chronic pain syndrome: Secondary | ICD-10-CM | POA: Diagnosis not present

## 2017-09-27 LAB — BASIC METABOLIC PANEL
Anion gap: 9 (ref 5–15)
BUN: 15 mg/dL (ref 8–23)
CO2: 28 mmol/L (ref 22–32)
Calcium: 9.2 mg/dL (ref 8.9–10.3)
Chloride: 98 mmol/L (ref 98–111)
Creatinine, Ser: 1.36 mg/dL — ABNORMAL HIGH (ref 0.61–1.24)
GFR calc Af Amer: >60 mL/min (ref >60)
GFR calc non Af Amer: 55 mL/min — ABNORMAL LOW (ref >60)
Glucose, Bld: 92 mg/dL (ref 70–99)
Potassium: 4.1 mmol/L (ref 3.5–5.1)
Sodium: 135 mmol/L (ref 135–145)

## 2017-09-27 LAB — CBC
HCT: 47.2 % (ref 39.0–52.0)
Hemoglobin: 15.9 g/dL (ref 13.0–17.0)
MCH: 32.6 pg (ref 26.0–34.0)
MCHC: 33.7 g/dL (ref 30.0–36.0)
MCV: 96.9 fL (ref 78.0–100.0)
Platelets: 285 10*3/uL (ref 150–400)
RBC: 4.87 MIL/uL (ref 4.22–5.81)
RDW: 13.7 % (ref 11.5–15.5)
WBC: 12.5 10*3/uL — ABNORMAL HIGH (ref 4.0–10.5)

## 2017-09-27 NOTE — Discharge Instructions (Signed)
Stop taking the new blood pressure medication you were recently started on until you can follow-up with your PCP to discuss further. Get a blood pressure cuff and start keeping a log of your blood pressures to take with you on your next appointment.

## 2017-09-27 NOTE — ED Triage Notes (Addendum)
Pt sent here from his pain clinic in [place] for hypotension.  Per Dr office pt's bp was 84/56 and 79/58.  Pt alert and oriented x 4.  Ambulatory with no difficulties.  Pt reports he has a 2nd bp pill added 3 months ago and has not felt good since he started taking the medication.

## 2017-09-27 NOTE — ED Provider Notes (Signed)
[facility] EMERGENCY DEPARTMENT Provider Note   CSN: [id number] Arrival date & time: 09/27/17  1607     History   Chief Complaint Chief Complaint  Patient presents with  . Hypotension    HPI Joseph Ellison is a 62 y.o. male.  HPI   62 year old male referred from his pain clinic for further evaluation of hypotension.  He states they routinely check his vital signs.  Reportedly his initial blood pressure is 84/56 and on repeat was 79/58.  He does report feeling dizzy with changes in position.  He states that he has felt this way since being started on new blood pressure medication 2 to 3 months ago.  He says sometimes forgets to take it and has noticed that he feels better when he does not.  He does not routinely check his blood pressure at home.  He currently has no complaints aside from his chronic lower back pain.  Past Medical History:  Diagnosis Date  . Arthritis   . Back pain, chronic   . COPD (chronic obstructive pulmonary disease) ([place])   . Depression   . GERD (gastroesophageal reflux disease)   . Hypertension   . MRSA (methicillin resistant Staphylococcus aureus)   . Neurogenic pain, leg   . Primary localized osteoarthritis of right knee 12/28/2016    Patient Active Problem List   Diagnosis Date Noted  . Primary localized osteoarthritis of right knee 12/28/2016  . S/P knee replacement 12/28/2016    Past Surgical History:  Procedure Laterality Date  . ELBOW SURGERY    . NOSE SURGERY    . PARTIAL KNEE ARTHROPLASTY Right 12/28/2016   Procedure: UNICOMPARTMENTAL RIGHT KNEE;  Surgeon: Marchia Bond, MD;  Location: [facility];  Service: Orthopedics;  Laterality: Right;        Home Medications    Prior to Admission medications   Medication Sig Start Date End Date Taking? Authorizing Provider  albuterol (PROVENTIL) (2.5 MG/3ML) 0.083% nebulizer solution Take 2.5 mg by nebulization every 6 (six) hours as needed for wheezing or shortness of breath.    [provider]  cyclobenzaprine (FLEXERIL) 5 MG tablet Take 1 tablet (5 mg total) by mouth 3 (three) times daily as needed for muscle spasms. 12/28/16   Marchia Bond, MD  diazepam (VALIUM) 5 MG tablet Take 1 tablet (5 mg total) by mouth 3 (three) times daily. 12/28/16   Marchia Bond, MD  fluticasone (FLONASE) 50 MCG/ACT nasal spray Place 1 spray into both nostrils daily.    [provider]  gabapentin (NEURONTIN) 300 MG capsule Take 300 mg by mouth 3 (three) times daily. 07/12/13   [provider]  HYDROmorphone (DILAUDID) 2 MG tablet Take 1 tablet (2 mg total) by mouth every 4 (four) hours as needed for severe pain. 12/28/16   Marchia Bond, MD  lisinopril (PRINIVIL,ZESTRIL) 20 MG tablet Take 20 mg by mouth daily.  05/07/13   [provider]  lubiprostone (AMITIZA) 24 MCG capsule Take 24 mcg by mouth 2 (two) times daily with a meal.    [provider]  omeprazole (PRILOSEC) 40 MG capsule Take 40 mg by mouth daily. 07/14/13   [provider]  ondansetron (ZOFRAN) 4 MG tablet Take 1 tablet (4 mg total) by mouth every 8 (eight) hours as needed for nausea or vomiting. 12/28/16   Marchia Bond, MD  OVER THE COUNTER MEDICATION Take 1 capsule by mouth daily. Negenix Suppliment    [provider]  oxyCODONE-acetaminophen (PERCOCET) 10-325 MG tablet Take  1 tablet by mouth every 4 (four) hours as needed for pain. 12/28/16   Marchia Bond, MD  rivaroxaban (XARELTO) 10 MG TABS tablet Take 1 tablet (10 mg total) by mouth daily. 12/28/16   Marchia Bond, MD  sennosides-docusate sodium (SENOKOT-S) 8.6-50 MG tablet Take 2 tablets by mouth daily. 12/28/16   Marchia Bond, MD  tamsulosin (FLOMAX) 0.4 MG CAPS capsule Take 0.4 mg by mouth daily.  06/15/13   [provider]  Tetrahydrozoline HCl (VISINE OP) Place 2 drops into both eyes as needed (for dry eyes).    [provider]  traZODone (DESYREL) 100 MG tablet Take 100 mg by mouth at bedtime.   07/12/13   [provider]  venlafaxine XR (EFFEXOR-XR) 150 MG 24 hr capsule Take 150 mg by mouth daily with breakfast.    [provider]    Family History No family history on file.  Social History Social History   Tobacco Use  . Smoking status: Current Every Day Smoker    Packs/day: 0.25  . Smokeless tobacco: Never Used  Substance Use Topics  . Alcohol use: Not Currently  . Drug use: No     Allergies   Penicillins   Review of Systems Review of Systems  All systems reviewed and negative, other than as noted in HPI.  Physical Exam Updated Vital Signs BP 116/83   Pulse 95   Temp (!) 97.4 F (36.3 C) (Temporal)   Resp 16   Ht 5\' 9"  (1.753 m)   Wt 90.7 kg (200 lb)   SpO2 97%   BMI 29.53 kg/m   Physical Exam  Constitutional: He is oriented to person, place, and time. He appears well-developed and well-nourished. No distress.  HENT:  Head: Normocephalic and atraumatic.  Eyes: Conjunctivae are normal. Right eye exhibits no discharge. Left eye exhibits no discharge.  Neck: Neck supple.  Cardiovascular: Normal rate, regular rhythm and normal heart sounds. Exam reveals no gallop and no friction rub.  No murmur heard. Pulmonary/Chest: Effort normal and breath sounds normal. No respiratory distress.  Abdominal: Soft. He exhibits no distension. There is no tenderness.  Musculoskeletal: He exhibits no edema or tenderness.  Neurological: He is alert and oriented to person, place, and time. He displays normal reflexes. No cranial nerve deficit. He exhibits normal muscle tone.  Skin: Skin is warm and dry.  Psychiatric: He has a normal mood and affect. His behavior is normal. Thought content normal.  Nursing note and vitals reviewed.    ED Treatments / Results  Labs (all labs ordered are listed, but only abnormal results are displayed) Labs Reviewed  BASIC METABOLIC PANEL - Abnormal; Notable for the following components:      Result Value    Creatinine, Ser 1.36 (*)    GFR calc non Af Amer 55 (*)    All other components within normal limits  CBC - Abnormal; Notable for the following components:   WBC 12.5 (*)    All other components within normal limits    EKG EKG Interpretation  Date/Time:  Tuesday September 27 2017 17:51:57 EDT Ventricular Rate:  93 PR Interval:    QRS Duration: 97 QT Interval:  348 QTC Calculation: 433 R Axis:   -13 Text Interpretation:  Sinus rhythm Baseline wander in lead(s) I II aVR aVL V1 V2 V3 V4 V5 V6 Confirmed by Virgel Manifold [phone]) on 09/27/2017 6:04:21 PM   Radiology No results found.  Procedures Procedures (including critical care time)  Medications Ordered in ED  Medications - No data to display   Initial Impression / Assessment and Plan / ED Course  I have reviewed the triage vital signs and the nursing notes.  Pertinent labs & imaging results that were available during my care of the patient were reviewed by me and considered in my medical decision making (see chart for details).     61yM with hypotension. Now improved. Afebrile. Non-toxic. Based on his symptoms, I suspect may be on too much medication for HTN. Advised to hold this newer BP medication until he can follow-up with his PCP to discuss further. Encouraged to get a BP cuff, keep a log and take it with him to his next appointment. Drink more fluids for the next few days.   It has been determined that no acute conditions requiring further emergency intervention are present at this time. The patient has been advised of the diagnosis and plan. I reviewed any labs and imaging including any potential incidental findings. We have discussed signs and symptoms that warrant return to the ED and they are listed in the discharge instructions.    Final Clinical Impressions(s) / ED Diagnoses   Final diagnoses:  Hypotension, unspecified hypotension type    ED Discharge Orders    None       Virgel Manifold, MD 09/27/17  1815

## 2017-10-20 DIAGNOSIS — J441 Chronic obstructive pulmonary disease with (acute) exacerbation: Secondary | ICD-10-CM | POA: Diagnosis not present

## 2017-10-20 DIAGNOSIS — I10 Essential (primary) hypertension: Secondary | ICD-10-CM | POA: Diagnosis not present

## 2017-10-20 DIAGNOSIS — K210 Gastro-esophageal reflux disease with esophagitis: Secondary | ICD-10-CM | POA: Diagnosis not present

## 2017-11-03 DIAGNOSIS — M461 Sacroiliitis, not elsewhere classified: Secondary | ICD-10-CM | POA: Diagnosis not present

## 2017-11-03 DIAGNOSIS — G894 Chronic pain syndrome: Secondary | ICD-10-CM | POA: Diagnosis not present

## 2017-11-03 DIAGNOSIS — M47817 Spondylosis without myelopathy or radiculopathy, lumbosacral region: Secondary | ICD-10-CM | POA: Diagnosis not present

## 2017-11-03 DIAGNOSIS — M47812 Spondylosis without myelopathy or radiculopathy, cervical region: Secondary | ICD-10-CM | POA: Diagnosis not present

## 2017-11-29 DIAGNOSIS — J0101 Acute recurrent maxillary sinusitis: Secondary | ICD-10-CM | POA: Diagnosis not present

## 2017-11-29 DIAGNOSIS — J328 Other chronic sinusitis: Secondary | ICD-10-CM | POA: Diagnosis not present

## 2017-12-29 DIAGNOSIS — M47812 Spondylosis without myelopathy or radiculopathy, cervical region: Secondary | ICD-10-CM | POA: Diagnosis not present

## 2017-12-29 DIAGNOSIS — M461 Sacroiliitis, not elsewhere classified: Secondary | ICD-10-CM | POA: Diagnosis not present

## 2017-12-29 DIAGNOSIS — G894 Chronic pain syndrome: Secondary | ICD-10-CM | POA: Diagnosis not present

## 2017-12-29 DIAGNOSIS — M47817 Spondylosis without myelopathy or radiculopathy, lumbosacral region: Secondary | ICD-10-CM | POA: Diagnosis not present

## 2018-01-23 DIAGNOSIS — Z0000 Encounter for general adult medical examination without abnormal findings: Secondary | ICD-10-CM | POA: Diagnosis not present

## 2018-01-23 DIAGNOSIS — J449 Chronic obstructive pulmonary disease, unspecified: Secondary | ICD-10-CM | POA: Diagnosis not present

## 2018-01-23 DIAGNOSIS — Z131 Encounter for screening for diabetes mellitus: Secondary | ICD-10-CM | POA: Diagnosis not present

## 2018-01-23 DIAGNOSIS — Z7689 Persons encountering health services in other specified circumstances: Secondary | ICD-10-CM | POA: Diagnosis not present

## 2018-01-23 DIAGNOSIS — I10 Essential (primary) hypertension: Secondary | ICD-10-CM | POA: Diagnosis not present

## 2018-02-28 DIAGNOSIS — Z79891 Long term (current) use of opiate analgesic: Secondary | ICD-10-CM | POA: Diagnosis not present

## 2018-02-28 DIAGNOSIS — M461 Sacroiliitis, not elsewhere classified: Secondary | ICD-10-CM | POA: Diagnosis not present

## 2018-02-28 DIAGNOSIS — M47817 Spondylosis without myelopathy or radiculopathy, lumbosacral region: Secondary | ICD-10-CM | POA: Diagnosis not present

## 2018-02-28 DIAGNOSIS — M47812 Spondylosis without myelopathy or radiculopathy, cervical region: Secondary | ICD-10-CM | POA: Diagnosis not present

## 2018-02-28 DIAGNOSIS — G894 Chronic pain syndrome: Secondary | ICD-10-CM | POA: Diagnosis not present

## 2018-04-25 DIAGNOSIS — J449 Chronic obstructive pulmonary disease, unspecified: Secondary | ICD-10-CM | POA: Diagnosis not present

## 2018-04-25 DIAGNOSIS — Z1389 Encounter for screening for other disorder: Secondary | ICD-10-CM | POA: Diagnosis not present

## 2018-04-25 DIAGNOSIS — Z0000 Encounter for general adult medical examination without abnormal findings: Secondary | ICD-10-CM | POA: Diagnosis not present

## 2018-04-25 DIAGNOSIS — I10 Essential (primary) hypertension: Secondary | ICD-10-CM | POA: Diagnosis not present

## 2018-05-01 DIAGNOSIS — M461 Sacroiliitis, not elsewhere classified: Secondary | ICD-10-CM | POA: Diagnosis not present

## 2018-05-01 DIAGNOSIS — M47812 Spondylosis without myelopathy or radiculopathy, cervical region: Secondary | ICD-10-CM | POA: Diagnosis not present

## 2018-05-01 DIAGNOSIS — G894 Chronic pain syndrome: Secondary | ICD-10-CM | POA: Diagnosis not present

## 2018-05-01 DIAGNOSIS — M47817 Spondylosis without myelopathy or radiculopathy, lumbosacral region: Secondary | ICD-10-CM | POA: Diagnosis not present

## 2018-06-26 DIAGNOSIS — M47812 Spondylosis without myelopathy or radiculopathy, cervical region: Secondary | ICD-10-CM | POA: Diagnosis not present

## 2018-06-26 DIAGNOSIS — M47817 Spondylosis without myelopathy or radiculopathy, lumbosacral region: Secondary | ICD-10-CM | POA: Diagnosis not present

## 2018-06-26 DIAGNOSIS — G894 Chronic pain syndrome: Secondary | ICD-10-CM | POA: Diagnosis not present

## 2018-06-26 DIAGNOSIS — M461 Sacroiliitis, not elsewhere classified: Secondary | ICD-10-CM | POA: Diagnosis not present

## 2018-06-26 DIAGNOSIS — M62830 Muscle spasm of back: Secondary | ICD-10-CM | POA: Diagnosis not present

## 2018-07-25 DIAGNOSIS — I10 Essential (primary) hypertension: Secondary | ICD-10-CM | POA: Diagnosis not present

## 2018-07-25 DIAGNOSIS — J449 Chronic obstructive pulmonary disease, unspecified: Secondary | ICD-10-CM | POA: Diagnosis not present

## 2018-08-28 DIAGNOSIS — M461 Sacroiliitis, not elsewhere classified: Secondary | ICD-10-CM | POA: Diagnosis not present

## 2018-08-28 DIAGNOSIS — M47812 Spondylosis without myelopathy or radiculopathy, cervical region: Secondary | ICD-10-CM | POA: Diagnosis not present

## 2018-08-28 DIAGNOSIS — M62830 Muscle spasm of back: Secondary | ICD-10-CM | POA: Diagnosis not present

## 2018-08-28 DIAGNOSIS — G894 Chronic pain syndrome: Secondary | ICD-10-CM | POA: Diagnosis not present

## 2018-08-28 DIAGNOSIS — M47817 Spondylosis without myelopathy or radiculopathy, lumbosacral region: Secondary | ICD-10-CM | POA: Diagnosis not present

## 2018-09-12 ENCOUNTER — Ambulatory Visit (INDEPENDENT_AMBULATORY_CARE_PROVIDER_SITE_OTHER): Payer: Medicare Other | Admitting: Urology

## 2018-09-12 DIAGNOSIS — N5201 Erectile dysfunction due to arterial insufficiency: Secondary | ICD-10-CM | POA: Diagnosis not present

## 2018-09-12 DIAGNOSIS — N401 Enlarged prostate with lower urinary tract symptoms: Secondary | ICD-10-CM | POA: Diagnosis not present

## 2018-09-12 DIAGNOSIS — R351 Nocturia: Secondary | ICD-10-CM | POA: Diagnosis not present

## 2018-09-18 ENCOUNTER — Other Ambulatory Visit: Payer: Medicare Other

## 2018-09-18 ENCOUNTER — Other Ambulatory Visit: Payer: Self-pay

## 2018-09-18 DIAGNOSIS — Z20822 Contact with and (suspected) exposure to covid-19: Secondary | ICD-10-CM

## 2018-09-18 DIAGNOSIS — R6889 Other general symptoms and signs: Secondary | ICD-10-CM | POA: Diagnosis not present

## 2018-09-21 DIAGNOSIS — L509 Urticaria, unspecified: Secondary | ICD-10-CM | POA: Diagnosis not present

## 2018-09-22 LAB — NOVEL CORONAVIRUS, NAA: SARS-CoV-2, NAA: NOT DETECTED

## 2018-09-28 ENCOUNTER — Telehealth: Payer: Self-pay | Admitting: Family Medicine

## 2018-09-28 NOTE — Telephone Encounter (Signed)
Informed pt of negative covid results.  °

## 2018-10-12 DIAGNOSIS — Z7689 Persons encountering health services in other specified circumstances: Secondary | ICD-10-CM | POA: Diagnosis not present

## 2018-10-12 DIAGNOSIS — M5137 Other intervertebral disc degeneration, lumbosacral region: Secondary | ICD-10-CM | POA: Diagnosis not present

## 2018-10-12 DIAGNOSIS — J449 Chronic obstructive pulmonary disease, unspecified: Secondary | ICD-10-CM | POA: Diagnosis not present

## 2018-10-12 DIAGNOSIS — I10 Essential (primary) hypertension: Secondary | ICD-10-CM | POA: Diagnosis not present

## 2018-10-20 IMAGING — DX DG KNEE 1-2V*R*
2 series · 2 of 2 positions shown · non-contrast
Comparison: 05/17/2014

CLINICAL DATA: Status post right unicompartmental knee replacement

EXAM:
RIGHT KNEE - 1-2 VIEW

[knee ap]
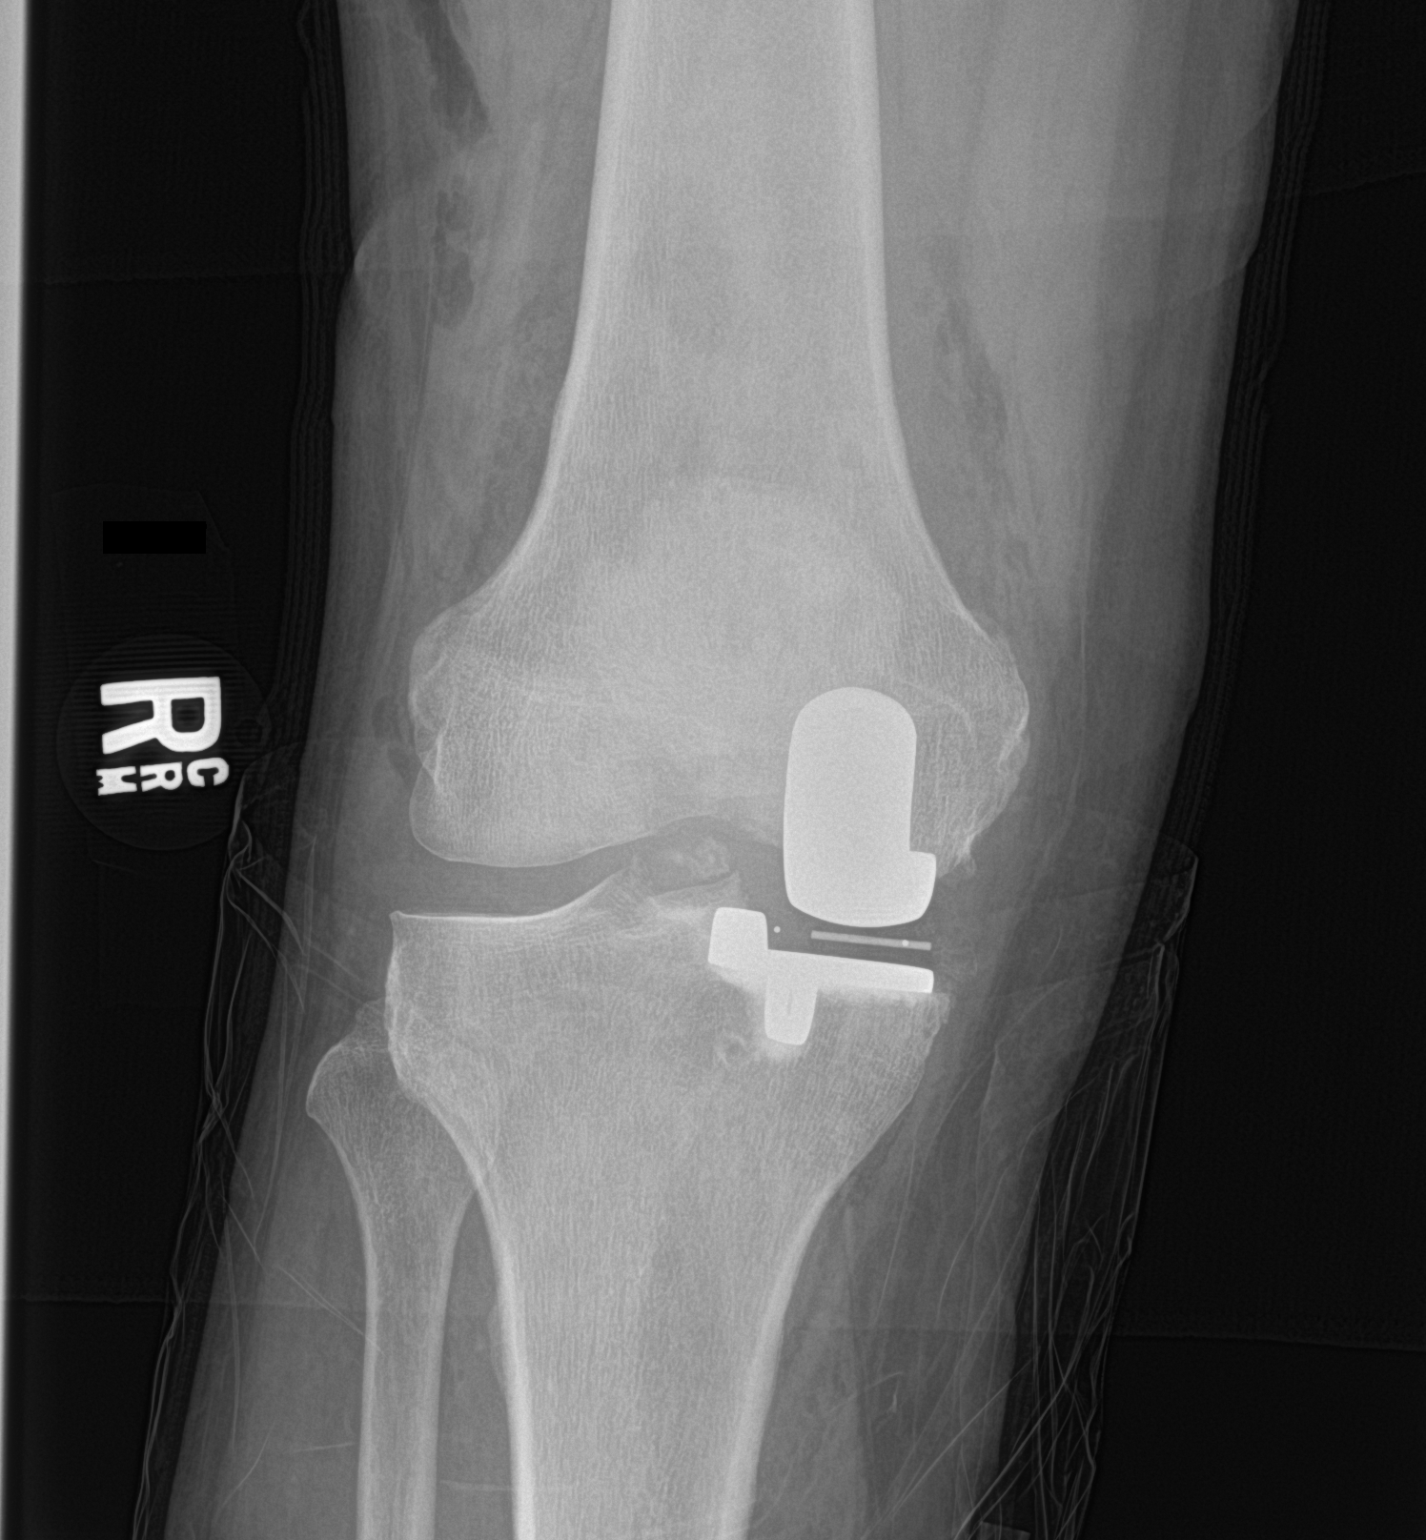

[knee lat]
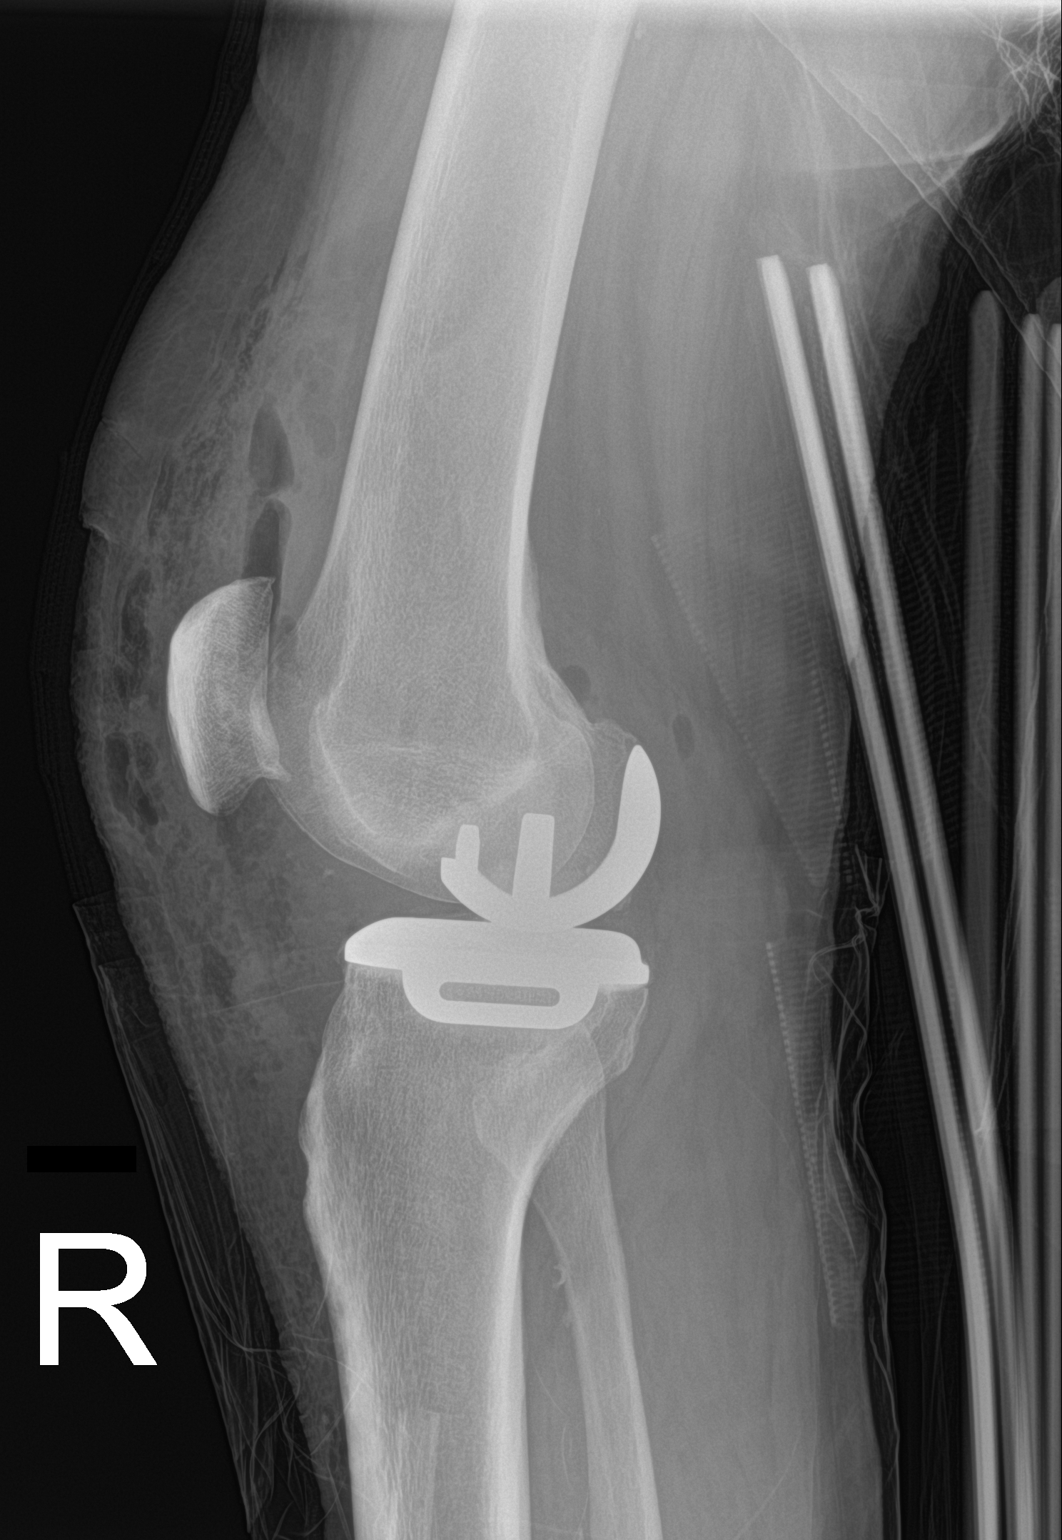

[2 of 2 positions shown; findings below may reference images not displayed]

FINDINGS: Medial unicompartmental knee arthroplasty. The joint is located.
There is expected soft tissue and joint gas with joint effusion.
Stable positioning of intraarticular body in the posterior joint
line.
IMPRESSION: No acute finding after unicompartmental arthroplasty.

## 2018-11-02 DIAGNOSIS — Z79891 Long term (current) use of opiate analgesic: Secondary | ICD-10-CM | POA: Diagnosis not present

## 2018-11-02 DIAGNOSIS — G894 Chronic pain syndrome: Secondary | ICD-10-CM | POA: Diagnosis not present

## 2018-11-02 DIAGNOSIS — M47812 Spondylosis without myelopathy or radiculopathy, cervical region: Secondary | ICD-10-CM | POA: Diagnosis not present

## 2018-11-02 DIAGNOSIS — M461 Sacroiliitis, not elsewhere classified: Secondary | ICD-10-CM | POA: Diagnosis not present

## 2018-11-02 DIAGNOSIS — M47817 Spondylosis without myelopathy or radiculopathy, lumbosacral region: Secondary | ICD-10-CM | POA: Diagnosis not present

## 2018-11-17 ENCOUNTER — Other Ambulatory Visit (HOSPITAL_COMMUNITY): Payer: Self-pay | Admitting: Respiratory Therapy

## 2018-11-17 DIAGNOSIS — J441 Chronic obstructive pulmonary disease with (acute) exacerbation: Secondary | ICD-10-CM

## 2018-11-30 DIAGNOSIS — J449 Chronic obstructive pulmonary disease, unspecified: Secondary | ICD-10-CM | POA: Diagnosis not present

## 2018-11-30 DIAGNOSIS — R0789 Other chest pain: Secondary | ICD-10-CM | POA: Diagnosis not present

## 2018-11-30 DIAGNOSIS — I10 Essential (primary) hypertension: Secondary | ICD-10-CM | POA: Diagnosis not present

## 2018-11-30 DIAGNOSIS — X58XXXA Exposure to other specified factors, initial encounter: Secondary | ICD-10-CM | POA: Diagnosis not present

## 2018-11-30 DIAGNOSIS — S46912A Strain of unspecified muscle, fascia and tendon at shoulder and upper arm level, left arm, initial encounter: Secondary | ICD-10-CM | POA: Diagnosis not present

## 2018-11-30 DIAGNOSIS — S53492A Other sprain of left elbow, initial encounter: Secondary | ICD-10-CM | POA: Diagnosis not present

## 2018-11-30 DIAGNOSIS — Z880 Allergy status to penicillin: Secondary | ICD-10-CM | POA: Diagnosis not present

## 2018-11-30 DIAGNOSIS — M79602 Pain in left arm: Secondary | ICD-10-CM | POA: Diagnosis not present

## 2018-11-30 DIAGNOSIS — R0602 Shortness of breath: Secondary | ICD-10-CM | POA: Diagnosis not present

## 2018-11-30 DIAGNOSIS — K219 Gastro-esophageal reflux disease without esophagitis: Secondary | ICD-10-CM | POA: Diagnosis not present

## 2018-11-30 DIAGNOSIS — R079 Chest pain, unspecified: Secondary | ICD-10-CM | POA: Diagnosis not present

## 2018-11-30 DIAGNOSIS — Z79899 Other long term (current) drug therapy: Secondary | ICD-10-CM | POA: Diagnosis not present

## 2018-12-06 ENCOUNTER — Encounter (HOSPITAL_COMMUNITY): Payer: Self-pay

## 2018-12-06 ENCOUNTER — Other Ambulatory Visit: Payer: Self-pay

## 2018-12-06 ENCOUNTER — Ambulatory Visit (HOSPITAL_COMMUNITY)
Admission: RE | Admit: 2018-12-06 | Discharge: 2018-12-06 | Disposition: A | Discharge status: Home / Self Care | Payer: Medicare Other | Source: Ambulatory Visit | Attending: Family Medicine | Admitting: Family Medicine

## 2018-12-07 ENCOUNTER — Other Ambulatory Visit (HOSPITAL_COMMUNITY): Payer: Self-pay | Admitting: Respiratory Therapy

## 2018-12-07 DIAGNOSIS — J441 Chronic obstructive pulmonary disease with (acute) exacerbation: Secondary | ICD-10-CM

## 2018-12-21 ENCOUNTER — Encounter (HOSPITAL_COMMUNITY): Payer: Medicare Other

## 2018-12-25 ENCOUNTER — Other Ambulatory Visit: Payer: Self-pay

## 2018-12-25 NOTE — Patient Outreach (Signed)
[facility]) Care Management  12/25/2018  ULRICH CHAU 26-Aug-1955 [id number]   Medication Adherence call to Mr. Kastyn Steltenpohl patients telephone number belongs to some one else.patient is showing past due on Lisinopril 20 mg under [place].   [facility] Management Direct Dial [phone]  Fax [phone] [email or web address]

## 2018-12-27 ENCOUNTER — Other Ambulatory Visit: Payer: Self-pay

## 2018-12-27 DIAGNOSIS — Z20822 Contact with and (suspected) exposure to covid-19: Secondary | ICD-10-CM

## 2018-12-28 DIAGNOSIS — M461 Sacroiliitis, not elsewhere classified: Secondary | ICD-10-CM | POA: Diagnosis not present

## 2018-12-28 DIAGNOSIS — G894 Chronic pain syndrome: Secondary | ICD-10-CM | POA: Diagnosis not present

## 2018-12-28 DIAGNOSIS — M47817 Spondylosis without myelopathy or radiculopathy, lumbosacral region: Secondary | ICD-10-CM | POA: Diagnosis not present

## 2018-12-28 DIAGNOSIS — M47812 Spondylosis without myelopathy or radiculopathy, cervical region: Secondary | ICD-10-CM | POA: Diagnosis not present

## 2018-12-29 ENCOUNTER — Other Ambulatory Visit: Payer: Self-pay

## 2018-12-29 ENCOUNTER — Ambulatory Visit (HOSPITAL_COMMUNITY)
Admission: RE | Admit: 2018-12-29 | Discharge: 2018-12-29 | Disposition: A | Discharge status: Home / Self Care | Payer: Medicare Other | Source: Ambulatory Visit | Attending: Family Medicine | Admitting: Family Medicine

## 2018-12-29 DIAGNOSIS — J441 Chronic obstructive pulmonary disease with (acute) exacerbation: Secondary | ICD-10-CM | POA: Insufficient documentation

## 2018-12-29 LAB — PULMONARY FUNCTION TEST
DL/VA % pred: 95 %
DL/VA: 4.04 ml/min/mmHg/L
DLCO unc % pred: 76 %
DLCO unc: 19.52 ml/min/mmHg
FEF 25-75 Post: 1.64 L/sec
FEF 25-75 Pre: 1.33 L/sec
FEF2575-%Change-Post: 23 %
FEF2575-%Pred-Post: 61 %
FEF2575-%Pred-Pre: 49 %
FEV1-%Change-Post: 7 %
FEV1-%Pred-Post: 61 %
FEV1-%Pred-Pre: 56 %
FEV1-Post: 2.01 L
FEV1-Pre: 1.87 L
FEV1FVC-%Change-Post: -3 %
FEV1FVC-%Pred-Pre: 96 %
FEV6-%Change-Post: 10 %
FEV6-%Pred-Post: 67 %
FEV6-%Pred-Pre: 60 %
FEV6-Post: 2.8 L
FEV6-Pre: 2.53 L
FEV6FVC-%Change-Post: -1 %
FEV6FVC-%Pred-Post: 102 %
FEV6FVC-%Pred-Pre: 103 %
FVC-%Change-Post: 12 %
FVC-%Pred-Post: 65 %
FVC-%Pred-Pre: 58 %
FVC-Post: 2.88 L
FVC-Pre: 2.57 L
Post FEV1/FVC ratio: 70 %
Post FEV6/FVC ratio: 98 %
Pre FEV1/FVC ratio: 73 %
Pre FEV6/FVC Ratio: 99 %
RV % pred: 154 %
RV: 3.37 L
TLC % pred: 93 %
TLC: 6.16 L

## 2018-12-29 LAB — NOVEL CORONAVIRUS, NAA: SARS-CoV-2, NAA: NOT DETECTED

## 2018-12-29 MED ORDER — ALBUTEROL SULFATE (2.5 MG/3ML) 0.083% IN NEBU
2.5000 mg | INHALATION_SOLUTION | Freq: Once | RESPIRATORY_TRACT | Status: AC
  Administered 2018-12-29: 2.5 mg via RESPIRATORY_TRACT

## 2019-01-11 DIAGNOSIS — Z23 Encounter for immunization: Secondary | ICD-10-CM | POA: Diagnosis not present

## 2019-01-11 DIAGNOSIS — Z7289 Other problems related to lifestyle: Secondary | ICD-10-CM | POA: Diagnosis not present

## 2019-01-11 DIAGNOSIS — Z13220 Encounter for screening for lipoid disorders: Secondary | ICD-10-CM | POA: Diagnosis not present

## 2019-01-11 DIAGNOSIS — Z0000 Encounter for general adult medical examination without abnormal findings: Secondary | ICD-10-CM | POA: Diagnosis not present

## 2019-01-11 DIAGNOSIS — I10 Essential (primary) hypertension: Secondary | ICD-10-CM | POA: Diagnosis not present

## 2019-01-11 DIAGNOSIS — J449 Chronic obstructive pulmonary disease, unspecified: Secondary | ICD-10-CM | POA: Diagnosis not present

## 2019-01-11 DIAGNOSIS — R7302 Impaired glucose tolerance (oral): Secondary | ICD-10-CM | POA: Diagnosis not present

## 2019-02-27 ENCOUNTER — Encounter: Payer: Self-pay | Admitting: Urology

## 2019-03-06 DIAGNOSIS — I10 Essential (primary) hypertension: Secondary | ICD-10-CM | POA: Diagnosis not present

## 2019-03-06 DIAGNOSIS — G894 Chronic pain syndrome: Secondary | ICD-10-CM | POA: Diagnosis not present

## 2019-04-05 DIAGNOSIS — I10 Essential (primary) hypertension: Secondary | ICD-10-CM | POA: Diagnosis not present

## 2019-04-05 DIAGNOSIS — J449 Chronic obstructive pulmonary disease, unspecified: Secondary | ICD-10-CM | POA: Diagnosis not present

## 2019-04-05 DIAGNOSIS — G894 Chronic pain syndrome: Secondary | ICD-10-CM | POA: Diagnosis not present

## 2019-04-30 DIAGNOSIS — G894 Chronic pain syndrome: Secondary | ICD-10-CM | POA: Diagnosis not present

## 2019-04-30 DIAGNOSIS — M79643 Pain in unspecified hand: Secondary | ICD-10-CM | POA: Diagnosis not present

## 2019-04-30 DIAGNOSIS — M545 Low back pain: Secondary | ICD-10-CM | POA: Diagnosis not present

## 2019-04-30 DIAGNOSIS — M25519 Pain in unspecified shoulder: Secondary | ICD-10-CM | POA: Diagnosis not present

## 2019-04-30 DIAGNOSIS — M130 Polyarthritis, unspecified: Secondary | ICD-10-CM | POA: Diagnosis not present

## 2019-05-01 ENCOUNTER — Encounter: Payer: Self-pay | Admitting: Urology

## 2019-05-01 ENCOUNTER — Other Ambulatory Visit: Payer: Self-pay

## 2019-05-01 ENCOUNTER — Ambulatory Visit (INDEPENDENT_AMBULATORY_CARE_PROVIDER_SITE_OTHER): Payer: Medicare Other | Admitting: Urology

## 2019-05-01 VITALS — BP 151/96 | HR 112 | Temp 97.5°F | Ht 69.0 in | Wt 254.0 lb

## 2019-05-01 DIAGNOSIS — N521 Erectile dysfunction due to diseases classified elsewhere: Secondary | ICD-10-CM

## 2019-05-01 DIAGNOSIS — R350 Frequency of micturition: Secondary | ICD-10-CM | POA: Diagnosis not present

## 2019-05-01 LAB — POCT URINALYSIS DIPSTICK
Bilirubin, UA: NEGATIVE
Glucose, UA: NEGATIVE
Leukocytes, UA: NEGATIVE
Nitrite, UA: NEGATIVE
Protein, UA: NEGATIVE
Spec Grav, UA: >=1.03 — AB (ref 1.010–1.025)
Urobilinogen, UA: NEGATIVE E.U./dL — AB
pH, UA: 5 (ref 5.0–8.0)

## 2019-05-01 MED ORDER — SILDENAFIL CITRATE 100 MG PO TABS: 100.0000 mg | ORAL_TABLET | Freq: Every day | ORAL | 11 refills | Status: DC | PRN

## 2019-05-01 NOTE — Progress Notes (Signed)
Urological Symptom Review  Patient is experiencing the following symptoms: Frequent urination Get up at night to urinate Leakage of urine Stream starts and stops Trouble starting stream Erection problems (male only)   Review of Systems  Gastrointestinal (upper)  : Indigestion/heartburn  Gastrointestinal (lower) : Constipation  Constitutional : Night Sweats  Skin: Itching  Eyes: Blurred vision  Ear/Nose/Throat : Sinus problems  Hematologic/Lymphatic: Easy bruising  Cardiovascular : Negative for cardiovascular symptoms  Respiratory : Negative for respiratory symptoms  Endocrine: Excessive thirst  Musculoskeletal: Back pain Joint pain  Neurological: Negative for neurological symptoms  Psychologic: Depression Anxiety

## 2019-05-01 NOTE — Progress Notes (Signed)
H&P  Chief Complaint: Erectile Dysfunction + Flow Sx's  History of Present Illness:   2.23.2021: He returns today for follow-up. He states that he did not receive any instructions regarding next steps or medications from last visit -- he has not started on any meds in the interval as planned. Today, he is now experiencing severe pain in his testicles with onset of urinary urgency -- this pain is resolved after voiding. He is taking tamsulosin each morning and has no issues with weak stream except for late at night. He also c/o high urinary freq. He reports drinking quite a bit of tea -- he adds a cup of sugar per half gallon of tea -- and beer. He was told that he was "borderline diabetic" several years ago by his old PCP and has not had any follow-up on this since.   Additionally, he c/o erectile dysfunction and claims he has had Peyronie's Disease for some time but has not mentioned this at previous OV's. He is still interested in starting on sildenafil. In describing his apparent "Peyronnie's Disease," he states that his erections have not been as hard as they used to be but denies any curvature. He also c/o apparent shrinking of his penis. He was started on prednisone this last year and believes this made him gain a significant amount of weight (202 --> over 250 in 6 mo's).   IPSS Questionnaire (AUA-7): Over the past month.   1)  How often have you had a sensation of not emptying your bladder completely after you finish urinating?  3 - About half the time  2)  How often have you had to urinate again less than two hours after you finished urinating? 5 - Almost always  3)  How often have you found you stopped and started again several times when you urinated?  5 - Almost always  4) How difficult have you found it to postpone urination?  5 - Almost always  5) How often have you had a weak urinary stream?  5 - Almost always  6) How often have you had to push or strain to begin urination?  5 - Almost  always  7) How many times did you most typically get up to urinate from the time you went to bed until the time you got up in the morning?  4 - 4 times  Total score:  0-7 mildly symptomatic   8-19 moderately symptomatic   20-35 severely symptomatic   Total: 32 QoL: 6  Sexual Health Inventory Form 1. How do you rate your confidence that you could get and keep an erection? 1 - Very low  2. When you had erections with stimulation, how often were your erections hard enough for penetration? 1 - Almost never or never  3. During sexual intercourse, how often were you able to maintain your erection after you had penetrated your partner?  2 - A few times (much less than half)  4. During sexual intercourse, how difficult was it to maintain your erection to completion of intercourse?  2 - Very difficult  5. When you attempted sexual intercourse, how often was it satisfactory for you? 2 - A few times (much less than half)   Total SHIM Score  8    (below copied from [facility] records):  ED: His symptoms did begin gradually. His symptoms have been worse over the last year.   He does have difficulties achieving an erection. He does have problems maintaining his erections.  7.7.20: Reports recently developing issues with ED. He has no tried any medication for this condition. Has no heart issues.   Freq, Nocturia, Urgency: He has nocturia 4-5 times per night.   7.7.20: Reports issues with nocturia. He notes that when he has to urinate he experiences pain in his testicles rather than bladder. He reports that his duel medical therapy is managing his sx well enough, but is very dissatisfied with the pain.   IPSS: 11  QoL: 5   Past Medical History:  Diagnosis Date  . Arthritis   . Back pain, chronic   . COPD (chronic obstructive pulmonary disease) ([place])   . Depression   . GERD (gastroesophageal reflux disease)   . Hypertension   . MRSA (methicillin resistant Staphylococcus aureus)   . Neurogenic  pain, leg   . Primary localized osteoarthritis of right knee 12/28/2016    Past Surgical History:  Procedure Laterality Date  . ELBOW SURGERY    . NOSE SURGERY    . PARTIAL KNEE ARTHROPLASTY Right 12/28/2016   Procedure: UNICOMPARTMENTAL RIGHT KNEE;  Surgeon: Marchia Bond, MD;  Location: [facility];  Service: Orthopedics;  Laterality: Right;    Home Medications:  Allergies as of 05/01/2019      Reactions   Penicillins Hives   Ok with Ancef      Medication List       Accurate as of May 01, 2019  2:57 PM. If you have any questions, ask your nurse or doctor.        albuterol (2.5 MG/3ML) 0.083% nebulizer solution Commonly known as: PROVENTIL Take 2.5 mg by nebulization every 6 (six) hours as needed for wheezing or shortness of breath.   amLODipine 10 MG tablet Commonly known as: NORVASC   amLODipine 5 MG tablet Commonly known as: NORVASC Take 5 mg by mouth.   amLODipine 5 MG tablet Commonly known as: NORVASC Take 5 mg by mouth daily.   budesonide-formoterol 160-4.5 MCG/ACT inhaler Commonly known as: SYMBICORT Inhale into the lungs.   cyclobenzaprine 5 MG tablet Commonly known as: FLEXERIL Take 1 tablet (5 mg total) by mouth 3 (three) times daily as needed for muscle spasms.   diazepam 5 MG tablet Commonly known as: VALIUM Take 1 tablet (5 mg total) by mouth 3 (three) times daily.   diazepam 5 MG tablet Commonly known as: VALIUM TAKE 1 TABLET EVERY 12 HOURS AS NEEDED FOR MUSCLE SPASMS.   DULoxetine 30 MG capsule Commonly known as: CYMBALTA Take 1 capsule daily x 2 weeks; then increase to 2 capsules daily as tolerated for chronic pain   finasteride 5 MG tablet Commonly known as: PROSCAR Take by mouth.   finasteride 5 MG tablet Commonly known as: PROSCAR Take 5 mg by mouth daily.   FLUoxetine 40 MG capsule Commonly known as: PROZAC   FLUoxetine 20 MG capsule Commonly known as: PROZAC Take 20 mg by mouth daily.   FLUoxetine 10 MG capsule Commonly  known as: PROZAC   fluticasone 50 MCG/ACT nasal spray Commonly known as: FLONASE Place 1 spray into both nostrils daily.   fluticasone 50 MCG/ACT nasal spray Commonly known as: FLONASE 1 spray by Each Nare route daily.   gabapentin 300 MG capsule Commonly known as: NEURONTIN Take 300 mg by mouth 3 (three) times daily.   HYDROmorphone 2 MG tablet Commonly known as: Dilaudid Take 1 tablet (2 mg total) by mouth every 4 (four) hours as needed for severe pain.   hydrOXYzine 25 MG tablet Commonly known as:  ATARAX/VISTARIL Take by mouth.   hydrOXYzine 25 MG tablet Commonly known as: ATARAX/VISTARIL   ipratropium 0.02 % nebulizer solution Commonly known as: ATROVENT Take 500 mcg by nebulization 4 (four) times a day as needed for Wheezing.   lisinopril 20 MG tablet Commonly known as: ZESTRIL Take 20 mg by mouth daily.   lisinopril 20 MG tablet Commonly known as: ZESTRIL Take by mouth.   lubiprostone 24 MCG capsule Commonly known as: AMITIZA Take 24 mcg by mouth 2 (two) times daily with a meal.   omeprazole 40 MG capsule Commonly known as: PRILOSEC Take 40 mg by mouth daily.   ondansetron 4 MG tablet Commonly known as: Zofran Take 1 tablet (4 mg total) by mouth every 8 (eight) hours as needed for nausea or vomiting.   OVER THE COUNTER MEDICATION Take 1 capsule by mouth daily. Negenix Suppliment   oxyCODONE-acetaminophen 10-325 MG tablet Commonly known as: PERCOCET Take 1 tablet by mouth every 4 (four) hours as needed for pain.   oxyCODONE-acetaminophen 10-325 MG tablet Commonly known as: PERCOCET Take by mouth.   rivaroxaban 10 MG Tabs tablet Commonly known as: Xarelto Take 1 tablet (10 mg total) by mouth daily.   sennosides-docusate sodium 8.6-50 MG tablet Commonly known as: SENOKOT-S Take 2 tablets by mouth daily.   tamsulosin 0.4 MG Caps capsule Commonly known as: FLOMAX Take 0.4 mg by mouth daily.   tamsulosin 0.4 MG Caps capsule Commonly known as:  FLOMAX Take by mouth.   tetrahydrozoline 0.05 % ophthalmic solution Apply to eye.   traZODone 100 MG tablet Commonly known as: DESYREL Take 100 mg by mouth at bedtime.   traZODone 100 MG tablet Commonly known as: DESYREL Take by mouth.   venlafaxine XR 150 MG 24 hr capsule Commonly known as: EFFEXOR-XR Take 150 mg by mouth daily with breakfast.   venlafaxine XR 150 MG 24 hr capsule Commonly known as: EFFEXOR-XR Take by mouth.   VISINE OP Place 2 drops into both eyes as needed (for dry eyes).       Allergies:  Allergies  Allergen Reactions  . Penicillins Hives    Ok with Ancef    No family history on file.  Social History:  reports that he has been smoking. He has been smoking about 0.25 packs per day. He has never used smokeless tobacco. He reports previous alcohol use. He reports that he does not use drugs.  ROS: A complete review of systems was performed.  All systems are negative except for pertinent findings as noted.  Physical Exam:  Vital signs in last 24 hours: BP (!) 151/96   Pulse (!) 112   Temp (!) 97.5 F (36.4 C)   Ht 5\' 9"  (1.753 m)   Wt 254 lb (115.2 kg)   BMI 37.51 kg/m  Constitutional:  Alert and oriented, No acute distress, Obese Cardiovascular: Regular rate  Respiratory: Labored breathing. GI: Abdomen is obese. Right inguinal hernia.  Genitourinary: Normal male phallus, testes are descended bilaterally and non-tender and without masses, scrotum is normal in appearance without lesions or masses, perineum is normal on inspection. Lymphatic: No lymphadenopathy Neurologic: Grossly intact, no focal deficits Psychiatric: Normal mood and affect  Laboratory Data:  No results for input(s): WBC, HGB, HCT, PLT in the last 72 hours.  No results for input(s): NA, K, CL, GLUCOSE, BUN, CALCIUM, CREATININE in the last 72 hours.  Invalid input(s): CO3   Results for orders placed or performed in visit on 05/01/19 (from the past 24 hour(s))  POCT  urinalysis dipstick     Status: Abnormal   Collection Time: 05/01/19  2:29 PM  Result Value Ref Range   Color, UA dk. yellow    Clarity, UA clear    Glucose, UA Negative Negative   Bilirubin, UA neg    Ketones, UA moderate    Spec Grav, UA >=1.030 (A) 1.010 - 1.025   Blood, UA intact    pH, UA 5.0 5.0 - 8.0   Protein, UA Negative Negative   Urobilinogen, UA negative (A) 0.2 or 1.0 E.U./dL   Nitrite, UA neg    Leukocytes, UA Negative Negative   Appearance clear    Odor     No results found for this or any previous visit (from the past 240 hour(s)).   I have reviewed prior pt notes  I have reviewed notes from referring/previous physicians  I have reviewed urinalysis results  I have reviewed prior PSA results   Impression/Assessment:  To avoid issues with his rx, I will give him a paper rx for sildenafil.  With regards to his ball pain, I am not sure if there is any clear urological etiology. GU exam was completely normal. He does have a possible inguinal hernia on the rt side but it is unclear if those would produce his bilateral testicular pain with urge to urinate.  I believe that taking tamsulosin in the evening rather than the morning would be more effective for his urinary sx's -- I do not think he should be taking this 2x daily quite yet. We will reevaluate this at next OV.   Plan:  1. He was given a printed Rx for sildenafil.   2. Continue on tamsulosin -- he was instructed to take this in the evening rather than the morning.  3. Advised to limit sodium in his diet, informing him that the soups he has been eating are very high in salt. Consider compression socks, limit sugar in diet.  4. Return for OV in 1 yr.

## 2019-05-08 ENCOUNTER — Other Ambulatory Visit: Payer: Self-pay

## 2019-05-08 NOTE — Patient Outreach (Signed)
[facility]) Care Management  05/08/2019  MARTRELL RUMMLER 29-Jul-1955 [id number]   Medication Adherence call to mr. Jeanne Ivan Hippa Identifiers Verify spoke with patient he is past due on Lisinopril 20 mg,patient explain he takes 1 tablet daily and is on his way to pick up all his medications from the pharmacy. Mr. Bauer is showing past due under [employer].  [facility] Management Direct Dial [phone]  Fax [phone] [email or web address]

## 2019-05-28 DIAGNOSIS — M25579 Pain in unspecified ankle and joints of unspecified foot: Secondary | ICD-10-CM | POA: Diagnosis not present

## 2019-05-28 DIAGNOSIS — M79643 Pain in unspecified hand: Secondary | ICD-10-CM | POA: Diagnosis not present

## 2019-05-28 DIAGNOSIS — M545 Low back pain: Secondary | ICD-10-CM | POA: Diagnosis not present

## 2019-05-28 DIAGNOSIS — M25562 Pain in left knee: Secondary | ICD-10-CM | POA: Diagnosis not present

## 2019-05-28 DIAGNOSIS — M130 Polyarthritis, unspecified: Secondary | ICD-10-CM | POA: Diagnosis not present

## 2019-05-28 DIAGNOSIS — M25519 Pain in unspecified shoulder: Secondary | ICD-10-CM | POA: Diagnosis not present

## 2019-05-29 ENCOUNTER — Other Ambulatory Visit (HOSPITAL_BASED_OUTPATIENT_CLINIC_OR_DEPARTMENT_OTHER): Payer: Self-pay

## 2019-05-29 DIAGNOSIS — G4733 Obstructive sleep apnea (adult) (pediatric): Secondary | ICD-10-CM

## 2019-06-01 DIAGNOSIS — M461 Sacroiliitis, not elsewhere classified: Secondary | ICD-10-CM | POA: Diagnosis not present

## 2019-06-01 DIAGNOSIS — M545 Low back pain: Secondary | ICD-10-CM | POA: Diagnosis not present

## 2019-06-06 ENCOUNTER — Other Ambulatory Visit (HOSPITAL_COMMUNITY)
Admission: RE | Admit: 2019-06-06 | Discharge: 2019-06-06 | Disposition: A | Discharge status: Home / Self Care | Payer: Medicare Other | Source: Ambulatory Visit | Attending: Neurology | Admitting: Neurology

## 2019-06-25 DIAGNOSIS — M79643 Pain in unspecified hand: Secondary | ICD-10-CM | POA: Diagnosis not present

## 2019-06-25 DIAGNOSIS — M25519 Pain in unspecified shoulder: Secondary | ICD-10-CM | POA: Diagnosis not present

## 2019-06-25 DIAGNOSIS — M25562 Pain in left knee: Secondary | ICD-10-CM | POA: Diagnosis not present

## 2019-06-25 DIAGNOSIS — M130 Polyarthritis, unspecified: Secondary | ICD-10-CM | POA: Diagnosis not present

## 2019-06-25 DIAGNOSIS — M545 Low back pain: Secondary | ICD-10-CM | POA: Diagnosis not present

## 2019-06-25 DIAGNOSIS — M542 Cervicalgia: Secondary | ICD-10-CM | POA: Diagnosis not present

## 2019-07-16 DIAGNOSIS — R5382 Chronic fatigue, unspecified: Secondary | ICD-10-CM | POA: Diagnosis not present

## 2019-07-16 DIAGNOSIS — I10 Essential (primary) hypertension: Secondary | ICD-10-CM | POA: Diagnosis not present

## 2019-07-23 DIAGNOSIS — M542 Cervicalgia: Secondary | ICD-10-CM | POA: Diagnosis not present

## 2019-07-23 DIAGNOSIS — Z79891 Long term (current) use of opiate analgesic: Secondary | ICD-10-CM | POA: Diagnosis not present

## 2019-07-23 DIAGNOSIS — M25519 Pain in unspecified shoulder: Secondary | ICD-10-CM | POA: Diagnosis not present

## 2019-07-23 DIAGNOSIS — M79643 Pain in unspecified hand: Secondary | ICD-10-CM | POA: Diagnosis not present

## 2019-07-23 DIAGNOSIS — M545 Low back pain: Secondary | ICD-10-CM | POA: Diagnosis not present

## 2019-07-23 DIAGNOSIS — M25562 Pain in left knee: Secondary | ICD-10-CM | POA: Diagnosis not present

## 2019-08-20 DIAGNOSIS — K921 Melena: Secondary | ICD-10-CM | POA: Diagnosis not present

## 2019-08-20 DIAGNOSIS — N134 Hydroureter: Secondary | ICD-10-CM | POA: Diagnosis not present

## 2019-08-20 DIAGNOSIS — R109 Unspecified abdominal pain: Secondary | ICD-10-CM | POA: Diagnosis not present

## 2019-08-23 DIAGNOSIS — K921 Melena: Secondary | ICD-10-CM | POA: Diagnosis not present

## 2019-08-23 DIAGNOSIS — I700 Atherosclerosis of aorta: Secondary | ICD-10-CM | POA: Diagnosis not present

## 2019-08-23 DIAGNOSIS — D175 Benign lipomatous neoplasm of intra-abdominal organs: Secondary | ICD-10-CM | POA: Diagnosis not present

## 2019-08-23 DIAGNOSIS — K760 Fatty (change of) liver, not elsewhere classified: Secondary | ICD-10-CM | POA: Diagnosis not present

## 2019-09-03 ENCOUNTER — Other Ambulatory Visit: Payer: Self-pay

## 2019-09-03 ENCOUNTER — Telehealth: Payer: Self-pay | Admitting: Urology

## 2019-09-03 DIAGNOSIS — N521 Erectile dysfunction due to diseases classified elsewhere: Secondary | ICD-10-CM

## 2019-09-03 MED ORDER — SILDENAFIL CITRATE 100 MG PO TABS: 100.0000 mg | ORAL_TABLET | Freq: Every day | ORAL | 11 refills | Status: DC | PRN

## 2019-09-03 NOTE — Telephone Encounter (Signed)
Pt called and stated the pharmacy still does not have the refill information and would like a call back.

## 2019-09-03 NOTE — Telephone Encounter (Signed)
Called pt. Pt wanted rx for Sildenafil sent to [facility] instead of [facility]. New rx was faxed to [facility].

## 2019-09-11 ENCOUNTER — Telehealth: Payer: Self-pay | Admitting: Urology

## 2019-09-11 NOTE — Telephone Encounter (Signed)
Pt left vm sating the pharmacy will not refill his Rx. Wants a refill sent in.

## 2019-09-12 ENCOUNTER — Other Ambulatory Visit: Payer: Self-pay

## 2019-09-12 DIAGNOSIS — R350 Frequency of micturition: Secondary | ICD-10-CM

## 2019-09-12 DIAGNOSIS — N521 Erectile dysfunction due to diseases classified elsewhere: Secondary | ICD-10-CM

## 2019-09-12 MED ORDER — TAMSULOSIN HCL 0.4 MG PO CAPS: 0.4000 mg | ORAL_CAPSULE | Freq: Every day | ORAL | 5 refills | Status: DC

## 2019-09-12 MED ORDER — SILDENAFIL CITRATE 100 MG PO TABS: 100.0000 mg | ORAL_TABLET | Freq: Every day | ORAL | 11 refills | Status: DC | PRN

## 2019-09-12 NOTE — Telephone Encounter (Signed)
Pt states he never had rx for viagra and pharmacy never received any rx sent in for viagra. Viagra rx printed and faxed to [facility]. Pt also asked for tamsulosin refill. Submitted to [facility] as well.

## 2019-09-13 DIAGNOSIS — M545 Low back pain: Secondary | ICD-10-CM | POA: Diagnosis not present

## 2019-09-13 DIAGNOSIS — Z79891 Long term (current) use of opiate analgesic: Secondary | ICD-10-CM | POA: Diagnosis not present

## 2019-09-13 DIAGNOSIS — M542 Cervicalgia: Secondary | ICD-10-CM | POA: Diagnosis not present

## 2019-09-13 DIAGNOSIS — M25519 Pain in unspecified shoulder: Secondary | ICD-10-CM | POA: Diagnosis not present

## 2019-09-13 DIAGNOSIS — G4733 Obstructive sleep apnea (adult) (pediatric): Secondary | ICD-10-CM | POA: Diagnosis not present

## 2019-09-13 DIAGNOSIS — M79643 Pain in unspecified hand: Secondary | ICD-10-CM | POA: Diagnosis not present

## 2019-09-20 DIAGNOSIS — I5031 Acute diastolic (congestive) heart failure: Secondary | ICD-10-CM | POA: Diagnosis not present

## 2019-09-20 DIAGNOSIS — I10 Essential (primary) hypertension: Secondary | ICD-10-CM | POA: Diagnosis not present

## 2019-09-20 DIAGNOSIS — K219 Gastro-esophageal reflux disease without esophagitis: Secondary | ICD-10-CM | POA: Diagnosis not present

## 2019-09-20 DIAGNOSIS — Z743 Need for continuous supervision: Secondary | ICD-10-CM | POA: Diagnosis not present

## 2019-09-20 DIAGNOSIS — J9691 Respiratory failure, unspecified with hypoxia: Secondary | ICD-10-CM | POA: Diagnosis not present

## 2019-09-20 DIAGNOSIS — J449 Chronic obstructive pulmonary disease, unspecified: Secondary | ICD-10-CM | POA: Diagnosis not present

## 2019-09-20 DIAGNOSIS — R7989 Other specified abnormal findings of blood chemistry: Secondary | ICD-10-CM | POA: Diagnosis not present

## 2019-09-20 DIAGNOSIS — I5021 Acute systolic (congestive) heart failure: Secondary | ICD-10-CM | POA: Diagnosis not present

## 2019-09-20 DIAGNOSIS — Z7951 Long term (current) use of inhaled steroids: Secondary | ICD-10-CM | POA: Diagnosis not present

## 2019-09-20 DIAGNOSIS — Z880 Allergy status to penicillin: Secondary | ICD-10-CM | POA: Diagnosis not present

## 2019-09-20 DIAGNOSIS — J9602 Acute respiratory failure with hypercapnia: Secondary | ICD-10-CM | POA: Diagnosis not present

## 2019-09-20 DIAGNOSIS — I509 Heart failure, unspecified: Secondary | ICD-10-CM | POA: Diagnosis not present

## 2019-09-20 DIAGNOSIS — Z87891 Personal history of nicotine dependence: Secondary | ICD-10-CM | POA: Diagnosis not present

## 2019-09-20 DIAGNOSIS — Z91010 Allergy to peanuts: Secondary | ICD-10-CM | POA: Diagnosis not present

## 2019-09-20 DIAGNOSIS — J441 Chronic obstructive pulmonary disease with (acute) exacerbation: Secondary | ICD-10-CM | POA: Diagnosis not present

## 2019-09-20 DIAGNOSIS — R069 Unspecified abnormalities of breathing: Secondary | ICD-10-CM | POA: Diagnosis not present

## 2019-09-20 DIAGNOSIS — M1711 Unilateral primary osteoarthritis, right knee: Secondary | ICD-10-CM | POA: Diagnosis not present

## 2019-09-20 DIAGNOSIS — G4700 Insomnia, unspecified: Secondary | ICD-10-CM | POA: Diagnosis not present

## 2019-09-20 DIAGNOSIS — I248 Other forms of acute ischemic heart disease: Secondary | ICD-10-CM | POA: Diagnosis not present

## 2019-09-20 DIAGNOSIS — J9601 Acute respiratory failure with hypoxia: Secondary | ICD-10-CM | POA: Diagnosis not present

## 2019-09-20 DIAGNOSIS — R6889 Other general symptoms and signs: Secondary | ICD-10-CM | POA: Diagnosis not present

## 2019-09-20 DIAGNOSIS — I110 Hypertensive heart disease with heart failure: Secondary | ICD-10-CM | POA: Diagnosis not present

## 2019-09-20 DIAGNOSIS — D539 Nutritional anemia, unspecified: Secondary | ICD-10-CM | POA: Diagnosis not present

## 2019-09-20 DIAGNOSIS — R0902 Hypoxemia: Secondary | ICD-10-CM | POA: Diagnosis not present

## 2019-09-20 DIAGNOSIS — I517 Cardiomegaly: Secondary | ICD-10-CM | POA: Diagnosis not present

## 2019-09-20 DIAGNOSIS — G4709 Other insomnia: Secondary | ICD-10-CM | POA: Diagnosis not present

## 2019-09-20 DIAGNOSIS — R0602 Shortness of breath: Secondary | ICD-10-CM | POA: Diagnosis not present

## 2019-09-20 DIAGNOSIS — Z20822 Contact with and (suspected) exposure to covid-19: Secondary | ICD-10-CM | POA: Diagnosis not present

## 2019-09-20 DIAGNOSIS — G4733 Obstructive sleep apnea (adult) (pediatric): Secondary | ICD-10-CM | POA: Diagnosis not present

## 2019-10-01 ENCOUNTER — Encounter (INDEPENDENT_AMBULATORY_CARE_PROVIDER_SITE_OTHER): Payer: Self-pay | Admitting: Gastroenterology

## 2019-10-01 ENCOUNTER — Other Ambulatory Visit: Payer: Self-pay

## 2019-10-01 ENCOUNTER — Ambulatory Visit (INDEPENDENT_AMBULATORY_CARE_PROVIDER_SITE_OTHER): Payer: Medicare Other | Admitting: Gastroenterology

## 2019-10-01 DIAGNOSIS — K5900 Constipation, unspecified: Secondary | ICD-10-CM | POA: Insufficient documentation

## 2019-10-01 DIAGNOSIS — Z860100 Personal history of colon polyps, unspecified: Secondary | ICD-10-CM | POA: Insufficient documentation

## 2019-10-01 DIAGNOSIS — F1190 Opioid use, unspecified, uncomplicated: Secondary | ICD-10-CM | POA: Diagnosis not present

## 2019-10-01 DIAGNOSIS — K5903 Drug induced constipation: Secondary | ICD-10-CM

## 2019-10-01 MED ORDER — LINACLOTIDE 145 MCG PO CAPS: 145.0000 ug | ORAL_CAPSULE | Freq: Every day | ORAL | 5 refills | Status: DC

## 2019-10-01 NOTE — Progress Notes (Addendum)
Maylon Peppers, M.D. Gastroenterology & Hepatology [facility] [address] [place], [place] [address] Primary Care Physician: Celedonio Savage, MD [place] [address]  Referring MD: Leeanne Rio, MD  I will communicate my assessment and recommendations to the referring MD via EMR. Note: Occasional unusual wording and randomly placed punctuation marks may result from the use of speech recognition technology to transcribe this document"  Chief Complaint:  Abdominal pain and constipation  History of Present Illness: KORDELL JAFRI is a 64 y.o. male with PMH HTN, COPD, CHF, GERD, chronic opiate use, depression, who presents for evaluation of abdominal pain and constipation.  The patient reports that for the last 6 months he has presented upper abdominal pain, worse in his epigastric area which he describes as a pressure and does not radiate anywhere else.  He states that the pain is correlated to episode of constipation he has had for long-term.  He reports that he has noticed the amount of his stools has decreased substantially.  He has a bowel movement every other day but it has been more difficult for him to have an adequate bowel movement size.  He has to strain significantly to have a bowel movement.  He reports that he took Pepto-Bismol to improve his abdominal discomfort but this made the constipation worse and he noticed 1 episode of black stool at that time.  He has been taking Amitiza 24 mcg twice daily since 2012, he will has also been taking Percocet since 2006 1 tablet every 4-6 hours for management of his spinal arthritis.  The patient denies having any nausea, vomiting, fever, chills, hematochezia, melena, hematemesis, abdominal distention, abdominal pain, diarrhea, jaundice, pruritus.  He relates that he has lost close to 7 pounds after a recent hospitalization for heart failure exacerbation a week and a half  ago.  His appetite is preserved.  Patient reports taking omeprazole daily for management of her heartburn, which control his symptoms. Takes it daily, if he skips it he will have severe heartburn.  Patient had CT scan on with IV contrast 08/23/2019 - hepatic steatosis, no other findings.  Last EGD: 2017 - normal per patient Last Colonoscopy: 2017 - had it done in [place], reports had polyps but no reports are available - told to repeat in 3 years  FHx: neg for any gastrointestinal/liver disease, no malignancies Social: former smoking quit 1 month smoked for 40 years, neg alcohol or illicit drug use Surgical: abdominal hernia  Past Medical History: Past Medical History:  Diagnosis Date  . Arthritis   . Back pain, chronic   . COPD (chronic obstructive pulmonary disease) ([place])   . Depression   . GERD (gastroesophageal reflux disease)   . Hypertension   . MRSA (methicillin resistant Staphylococcus aureus)   . Neurogenic pain, leg   . Primary localized osteoarthritis of right knee 12/28/2016    Past Surgical History: Past Surgical History:  Procedure Laterality Date  . ELBOW SURGERY    . NOSE SURGERY    . PARTIAL KNEE ARTHROPLASTY Right 12/28/2016   Procedure: UNICOMPARTMENTAL RIGHT KNEE;  Surgeon: Marchia Bond, MD;  Location: [facility];  Service: Orthopedics;  Laterality: Right;    Family History:History reviewed. No pertinent family history.  Social History: Social History   Tobacco Use  Smoking Status Former Smoker  . Packs/day: 0.25  Smokeless Tobacco Never Used   Social History   Substance and Sexual Activity  Alcohol Use Not Currently  Social History   Substance and Sexual Activity  Drug Use No    Allergies: Allergies  Allergen Reactions  . Penicillins Hives    Ok with Ancef    Medications: Current Outpatient Medications  Medication Sig Dispense Refill  . albuterol (PROVENTIL) (2.5 MG/3ML) 0.083% nebulizer solution Take 2.5 mg by nebulization every 6  (six) hours as needed for wheezing or shortness of breath.    Marland Kitchen amLODipine (NORVASC) 10 MG tablet     . amLODipine (NORVASC) 5 MG tablet Take 5 mg by mouth daily.    Marland Kitchen aspirin 81 MG EC tablet Take by mouth.    . budesonide-formoterol (SYMBICORT) 160-4.5 MCG/ACT inhaler Inhale into the lungs.    . carvedilol (COREG) 6.25 MG tablet Take 6.25 mg by mouth 2 (two) times daily.    . diazepam (VALIUM) 5 MG tablet Take 1 tablet (5 mg total) by mouth 3 (three) times daily. 30 tablet 0  . DULoxetine (CYMBALTA) 30 MG capsule Take 1 capsule daily x 2 weeks; then increase to 2 capsules daily as tolerated for chronic pain    . finasteride (PROSCAR) 5 MG tablet Take by mouth.    . finasteride (PROSCAR) 5 MG tablet Take 5 mg by mouth daily.    . fluticasone (FLONASE) 50 MCG/ACT nasal spray Place 1 spray into both nostrils daily.    . furosemide (LASIX) 40 MG tablet Take by mouth.    Marland Kitchen HYDROmorphone (DILAUDID) 2 MG tablet Take 1 tablet (2 mg total) by mouth every 4 (four) hours as needed for severe pain. 50 tablet 0  . hydrOXYzine (ATARAX/VISTARIL) 25 MG tablet     . ipratropium (ATROVENT) 0.02 % nebulizer solution Take 500 mcg by nebulization 4 (four) times a day as needed for Wheezing.    Marland Kitchen lisinopril (PRINIVIL,ZESTRIL) 20 MG tablet Take 20 mg by mouth daily.     Marland Kitchen lisinopril (ZESTRIL) 20 MG tablet Take by mouth.    . lubiprostone (AMITIZA) 24 MCG capsule Take 24 mcg by mouth 2 (two) times daily with a meal.    . omeprazole (PRILOSEC) 40 MG capsule Take 40 mg by mouth daily.    . ondansetron (ZOFRAN) 4 MG tablet Take 1 tablet (4 mg total) by mouth every 8 (eight) hours as needed for nausea or vomiting. 30 tablet 0  . OVER THE COUNTER MEDICATION Take 1 capsule by mouth daily. Negenix Suppliment    . oxyCODONE-acetaminophen (PERCOCET) 10-325 MG tablet Take 1 tablet by mouth every 4 (four) hours as needed for pain. 50 tablet 0  . pravastatin (PRAVACHOL) 40 MG tablet Take by mouth.    . sennosides-docusate  sodium (SENOKOT-S) 8.6-50 MG tablet Take 2 tablets by mouth daily. 30 tablet 1  . sildenafil (VIAGRA) 100 MG tablet Take 1 tablet (100 mg total) by mouth daily as needed for erectile dysfunction. 15 tablet 11  . tamsulosin (FLOMAX) 0.4 MG CAPS capsule Take 1 capsule (0.4 mg total) by mouth daily. 30 capsule 5  . tetrahydrozoline 0.05 % ophthalmic solution Apply to eye.    . Tetrahydrozoline HCl (VISINE OP) Place 2 drops into both eyes as needed (for dry eyes).    Marland Kitchen amoxicillin (AMOXIL) 500 MG capsule Take 1,000 mg by mouth 2 (two) times daily.    Marland Kitchen FLUoxetine (PROZAC) 10 MG capsule     . FLUoxetine (PROZAC) 40 MG capsule     . fluticasone (FLONASE) 50 MCG/ACT nasal spray 1 spray by Each Nare route daily.    Marland Kitchen gabapentin (NEURONTIN)  300 MG capsule Take 300 mg by mouth 3 (three) times daily.    . hydrOXYzine (ATARAX/VISTARIL) 25 MG tablet Take by mouth.    . rivaroxaban (XARELTO) 10 MG TABS tablet Take 1 tablet (10 mg total) by mouth daily. 21 tablet 0  . traZODone (DESYREL) 100 MG tablet Take by mouth.    . venlafaxine XR (EFFEXOR-XR) 150 MG 24 hr capsule Take 150 mg by mouth daily with breakfast. (Patient not taking: Reported on 10/01/2019)    . venlafaxine XR (EFFEXOR-XR) 150 MG 24 hr capsule Take by mouth. (Patient not taking: Reported on 10/01/2019)     No current facility-administered medications for this visit.    Review of Systems: GENERAL: negative for malaise, night sweats HEENT: No changes in hearing or vision, no nose bleeds or other nasal problems. NECK: Negative for lumps, goiter, pain and significant neck swelling RESPIRATORY: Negative for cough, wheezing CARDIOVASCULAR: Negative for chest pain, leg swelling, palpitations, orthopnea GI: SEE HPI MUSCULOSKELETAL: Negative for joint pain or swelling, back pain, and muscle pain. SKIN: Negative for lesions, rash PSYCH: Negative for sleep disturbance, mood disorder and recent psychosocial stressors. HEMATOLOGY Negative for prolonged  bleeding, bruising easily, and swollen nodes. ENDOCRINE: Negative for cold or heat intolerance, polyuria, polydipsia and goiter. NEURO: negative for tremor, gait imbalance, syncope and seizures. The remainder of the review of systems is noncontributory.   Physical Exam: BP 115/74 (BP Location: Left Arm, Patient Position: Sitting, Cuff Size: Large)   Pulse 74   Temp 98.5 F (36.9 C)   Ht 5\' 9"  (1.753 m)   Wt (!) 243 lb 6.4 oz (110.4 kg)   BMI 35.94 kg/m  GENERAL: The patient is AO x3, in no acute distress. Obese. HEENT: Head is normocephalic and atraumatic. EOMI are intact. Mouth is well hydrated and without lesions. NECK: Supple. No masses LUNGS: Clear to auscultation. No presence of rhonchi/wheezing/rales. Adequate chest expansion HEART: RRR, normal s1 and s2. ABDOMEN: Soft, nontender, no guarding, no peritoneal signs, and nondistended. BS +. Hard lumps palpated through abdominal wall, possibly related to stool burden. EXTREMITIES: Without any cyanosis, clubbing, rash, lesions or edema. NEUROLOGIC: AOx3, no focal motor deficit. SKIN: no jaundice, no rashes   Imaging/Labs: as above  I personally reviewed and interpreted the available labs, imaging and endoscopic files.  Impression and Plan: SHAKIM FAITH is a 64 y.o. male with PMH HTN, COPD, CHF, GERD, chronic opiate use, depression, who presents for evaluation of abdominal pain and constipation.  Patient has had chronic constipation secondary to the use of opiates for the last 15 years.  Initially responded to the use of Amitiza but has recently failed to respond as usual.  He is still actively taking narcotics, which will make his constipation difficult to treat.  I had a discussion with the patient regarding the need to decrease the amount of opiates she is taking which she understood.  For now, we will switch his bowel regimen to Linzess 145 mcg/day and assess if the dose needs to be uptitrated.  We will check TSH to  evaluate for reversible causes of constipation.  Finally, the patient will require to have a repeat colonoscopy for surveillance of his history of colonic polyps (he is due for repeat colonoscopy as his last one was more than 3 years ago) but we will need to obtain clearance from his cardiologist as he had a recent hospitalization for decompensated heart failure.  The patient will reach his cardiologist and will get back to [place] to  schedule this procedure.  -Take prune juice and eat kiwi daily -Start Linzess 145 mcg qday -Stop Amitiza -Perform TSH -Obtain clearance from cardiologist to perform colonoscopy - will schedule it once we hear back from patient or cardiologist  All questions were answered.      Harvel Quale, MD Gastroenterology and Hepatology [facility]

## 2019-10-01 NOTE — Patient Instructions (Addendum)
Take prune juice and eat kiwi daily Start Linzess 145 mcg qday. Please call us if this is not helping after 3 weeks. Stop Amitiza Perform blood workup Obtain clearance from cardiologist to perform colonoscopy - will schedule it once we hear back from him or from you

## 2019-10-03 DIAGNOSIS — J9601 Acute respiratory failure with hypoxia: Secondary | ICD-10-CM | POA: Diagnosis not present

## 2019-10-03 DIAGNOSIS — I509 Heart failure, unspecified: Secondary | ICD-10-CM | POA: Diagnosis not present

## 2019-10-03 DIAGNOSIS — J449 Chronic obstructive pulmonary disease, unspecified: Secondary | ICD-10-CM | POA: Diagnosis not present

## 2019-10-03 DIAGNOSIS — Z09 Encounter for follow-up examination after completed treatment for conditions other than malignant neoplasm: Secondary | ICD-10-CM | POA: Diagnosis not present

## 2019-10-11 DIAGNOSIS — G4733 Obstructive sleep apnea (adult) (pediatric): Secondary | ICD-10-CM | POA: Diagnosis not present

## 2019-10-11 DIAGNOSIS — M79643 Pain in unspecified hand: Secondary | ICD-10-CM | POA: Diagnosis not present

## 2019-10-11 DIAGNOSIS — M25562 Pain in left knee: Secondary | ICD-10-CM | POA: Diagnosis not present

## 2019-10-11 DIAGNOSIS — Z79891 Long term (current) use of opiate analgesic: Secondary | ICD-10-CM | POA: Diagnosis not present

## 2019-10-11 DIAGNOSIS — M542 Cervicalgia: Secondary | ICD-10-CM | POA: Diagnosis not present

## 2019-10-11 DIAGNOSIS — M545 Low back pain: Secondary | ICD-10-CM | POA: Diagnosis not present

## 2019-10-12 DIAGNOSIS — J410 Simple chronic bronchitis: Secondary | ICD-10-CM | POA: Diagnosis not present

## 2019-10-12 DIAGNOSIS — K5900 Constipation, unspecified: Secondary | ICD-10-CM | POA: Diagnosis not present

## 2019-10-12 DIAGNOSIS — R0602 Shortness of breath: Secondary | ICD-10-CM | POA: Diagnosis not present

## 2019-10-22 DIAGNOSIS — I5032 Chronic diastolic (congestive) heart failure: Secondary | ICD-10-CM | POA: Diagnosis not present

## 2019-10-22 DIAGNOSIS — R079 Chest pain, unspecified: Secondary | ICD-10-CM | POA: Diagnosis not present

## 2019-10-22 DIAGNOSIS — I10 Essential (primary) hypertension: Secondary | ICD-10-CM | POA: Diagnosis not present

## 2019-10-22 DIAGNOSIS — R29818 Other symptoms and signs involving the nervous system: Secondary | ICD-10-CM | POA: Diagnosis not present

## 2019-10-26 DIAGNOSIS — R778 Other specified abnormalities of plasma proteins: Secondary | ICD-10-CM | POA: Diagnosis not present

## 2019-10-26 DIAGNOSIS — R0602 Shortness of breath: Secondary | ICD-10-CM | POA: Diagnosis not present

## 2019-10-26 DIAGNOSIS — R079 Chest pain, unspecified: Secondary | ICD-10-CM | POA: Diagnosis not present

## 2019-11-06 DIAGNOSIS — I10 Essential (primary) hypertension: Secondary | ICD-10-CM | POA: Diagnosis not present

## 2019-11-06 DIAGNOSIS — Z79891 Long term (current) use of opiate analgesic: Secondary | ICD-10-CM | POA: Diagnosis not present

## 2019-11-06 DIAGNOSIS — I5032 Chronic diastolic (congestive) heart failure: Secondary | ICD-10-CM | POA: Diagnosis not present

## 2019-11-06 DIAGNOSIS — M545 Low back pain: Secondary | ICD-10-CM | POA: Diagnosis not present

## 2019-11-07 ENCOUNTER — Other Ambulatory Visit (INDEPENDENT_AMBULATORY_CARE_PROVIDER_SITE_OTHER): Payer: Self-pay

## 2019-11-07 ENCOUNTER — Telehealth (INDEPENDENT_AMBULATORY_CARE_PROVIDER_SITE_OTHER): Payer: Self-pay | Admitting: *Deleted

## 2019-11-07 DIAGNOSIS — Z86010 Personal history of colonic polyps: Secondary | ICD-10-CM

## 2019-11-07 DIAGNOSIS — K5903 Drug induced constipation: Secondary | ICD-10-CM

## 2019-11-07 DIAGNOSIS — F1190 Opioid use, unspecified, uncomplicated: Secondary | ICD-10-CM

## 2019-11-07 NOTE — Telephone Encounter (Signed)
Patient called and said he waited and waited for them to call him to come back to have his labwork.  He will go tomorrow --Joseph Ellison has reput the lab order in and he mentioned about he was to have a TCS but I think we needed clearance.

## 2019-11-08 DIAGNOSIS — M79643 Pain in unspecified hand: Secondary | ICD-10-CM | POA: Diagnosis not present

## 2019-11-08 DIAGNOSIS — M542 Cervicalgia: Secondary | ICD-10-CM | POA: Diagnosis not present

## 2019-11-08 DIAGNOSIS — M25562 Pain in left knee: Secondary | ICD-10-CM | POA: Diagnosis not present

## 2019-11-08 DIAGNOSIS — G4733 Obstructive sleep apnea (adult) (pediatric): Secondary | ICD-10-CM | POA: Diagnosis not present

## 2019-11-08 DIAGNOSIS — K5903 Drug induced constipation: Secondary | ICD-10-CM | POA: Diagnosis not present

## 2019-11-08 DIAGNOSIS — K5900 Constipation, unspecified: Secondary | ICD-10-CM | POA: Diagnosis not present

## 2019-11-08 DIAGNOSIS — M545 Low back pain: Secondary | ICD-10-CM | POA: Diagnosis not present

## 2019-11-08 NOTE — Telephone Encounter (Signed)
Impression and Plan: Joseph Ellison is a 64 y.o. male with PMH HTN, COPD, CHF, GERD, chronic opiate use, depression, who presents for evaluation of abdominal pain and constipation.  Patient has had chronic constipation secondary to the use of opiates for the last 15 years.  Initially responded to the use of Amitiza but has recently failed to respond as usual.  He is still actively taking narcotics, which will make his constipation difficult to treat.  I had a discussion with the patient regarding the need to decrease the amount of opiates she is taking which she understood.  For now, we will switch his bowel regimen to Linzess 145 mcg/day and assess if the dose needs to be uptitrated.  We will check TSH to evaluate for reversible causes of constipation.  Finally, the patient will require to have a repeat colonoscopy for surveillance of his history of colonic polyps (he is due for repeat colonoscopy as his last one was more than 3 years ago) but we will need to obtain clearance from his cardiologist as he had a recent hospitalization for decompensated heart failure.  The patient will reach his cardiologist and will get back to [place] to schedule this procedure.   -Take prune juice and eat kiwi daily -Start Linzess 145 mcg qday -Stop Amitiza -Perform TSH -Obtain clearance from cardiologist to perform colonoscopy - will schedule it once we hear back from patient or cardiologist   It appears we are awaiting a clearance form Cardiologist and checking TSH to evaluate reversible causes of constipation.

## 2019-11-09 ENCOUNTER — Other Ambulatory Visit (INDEPENDENT_AMBULATORY_CARE_PROVIDER_SITE_OTHER): Payer: Self-pay | Admitting: Gastroenterology

## 2019-11-09 LAB — TSH: TSH: 0.52 mIU/L (ref 0.40–4.50)

## 2019-11-13 ENCOUNTER — Other Ambulatory Visit (INDEPENDENT_AMBULATORY_CARE_PROVIDER_SITE_OTHER): Payer: Self-pay | Admitting: *Deleted

## 2019-11-13 ENCOUNTER — Telehealth (INDEPENDENT_AMBULATORY_CARE_PROVIDER_SITE_OTHER): Payer: Self-pay | Admitting: *Deleted

## 2019-11-13 ENCOUNTER — Encounter (INDEPENDENT_AMBULATORY_CARE_PROVIDER_SITE_OTHER): Payer: Self-pay | Admitting: *Deleted

## 2019-11-13 MED ORDER — PLENVU 140 G PO SOLR: 1.0000 | Freq: Once | ORAL | 0 refills | Status: AC

## 2019-11-13 NOTE — Telephone Encounter (Signed)
Patient needs Plenvu (copay card) ° °

## 2019-11-14 ENCOUNTER — Other Ambulatory Visit (HOSPITAL_BASED_OUTPATIENT_CLINIC_OR_DEPARTMENT_OTHER): Payer: Self-pay

## 2019-11-14 DIAGNOSIS — G4733 Obstructive sleep apnea (adult) (pediatric): Secondary | ICD-10-CM

## 2019-11-29 NOTE — Patient Instructions (Addendum)
Your procedure is scheduled on: 12/07/2019   Report to [facility] at  8:00   AM.  Call this number if you have problems the morning of surgery: [phone]   Remember:              Follow Directions on the letter you received from Your Physician's office regarding the Bowel Prep              No Smoking the day of Procedure :   Take these medicines the morning of surgery with A SIP OF WATER: Carvedilol, cymbalta and flomax use inhalers if needed   Do not wear jewelry, make-up or nail polish.    Do not bring valuables to the hospital.  Contacts, dentures or bridgework may not be worn into surgery.  .   Patients discharged the day of surgery will not be allowed to drive home.     Colonoscopy, Adult, Care After This sheet gives you information about how to care for yourself after your procedure. Your health care provider may also give you more specific instructions. If you have problems or questions, contact your health care provider. What can I expect after the procedure? After the procedure, it is common to have:  A small amount of blood in your stool for 24 hours after the procedure.  Some gas.  Mild abdominal cramping or bloating.  Follow these instructions at home: General instructions   For the first 24 hours after the procedure: ? Do not drive or use machinery. ? Do not sign important documents. ? Do not drink alcohol. ? Do your regular daily activities at a slower pace than normal. ? Eat soft, easy-to-digest foods. ? Rest often.  Take over-the-counter or prescription medicines only as told by your health care provider.  It is up to you to get the results of your procedure. Ask your health care provider, or the department performing the procedure, when your results will be ready. Relieving cramping and bloating  Try walking around when you have cramps or feel bloated.  Apply heat to your abdomen as told by your health care provider. Use a heat source that  your health care provider recommends, such as a moist heat pack or a heating pad. ? Place a towel between your skin and the heat source. ? Leave the heat on for 20-30 minutes. ? Remove the heat if your skin turns bright red. This is especially important if you are unable to feel pain, heat, or cold. You may have a greater risk of getting burned. Eating and drinking  Drink enough fluid to keep your urine clear or pale yellow.  Resume your normal diet as instructed by your health care provider. Avoid heavy or fried foods that are hard to digest.  Avoid drinking alcohol for as long as instructed by your health care provider. Contact a health care provider if:  You have blood in your stool 2-3 days after the procedure. Get help right away if:  You have more than a small spotting of blood in your stool.  You pass large blood clots in your stool.  Your abdomen is swollen.  You have nausea or vomiting.  You have a fever.  You have increasing abdominal pain that is not relieved with medicine. This information is not intended to replace advice given to you by your health care provider. Make sure you discuss any questions you have with your health care provider. Document Released: 10/07/2003 Document Revised: 11/17/2015 Document Reviewed: 05/06/2015 Elsevier  Interactive Patient Education  [employer].

## 2019-12-05 ENCOUNTER — Other Ambulatory Visit: Payer: Self-pay

## 2019-12-05 ENCOUNTER — Encounter (HOSPITAL_COMMUNITY)
Admission: RE | Admit: 2019-12-05 | Discharge: 2019-12-05 | Disposition: A | Discharge status: Home / Self Care | Payer: Medicare Other | Source: Ambulatory Visit | Attending: Gastroenterology | Admitting: Gastroenterology

## 2019-12-05 ENCOUNTER — Other Ambulatory Visit (HOSPITAL_COMMUNITY)
Admission: RE | Admit: 2019-12-05 | Discharge: 2019-12-05 | Disposition: A | Discharge status: Home / Self Care | Payer: Medicare Other | Source: Ambulatory Visit | Attending: Gastroenterology | Admitting: Gastroenterology

## 2019-12-05 ENCOUNTER — Encounter (HOSPITAL_COMMUNITY): Payer: Self-pay

## 2019-12-05 DIAGNOSIS — Z20822 Contact with and (suspected) exposure to covid-19: Secondary | ICD-10-CM | POA: Diagnosis not present

## 2019-12-05 DIAGNOSIS — Z01818 Encounter for other preprocedural examination: Secondary | ICD-10-CM | POA: Diagnosis not present

## 2019-12-05 DIAGNOSIS — I10 Essential (primary) hypertension: Secondary | ICD-10-CM | POA: Insufficient documentation

## 2019-12-05 LAB — BASIC METABOLIC PANEL
Anion gap: 11 (ref 5–15)
BUN: 12 mg/dL (ref 8–23)
CO2: 28 mmol/L (ref 22–32)
Calcium: 9.3 mg/dL (ref 8.9–10.3)
Chloride: 98 mmol/L (ref 98–111)
Creatinine, Ser: 0.64 mg/dL (ref 0.61–1.24)
GFR calc Af Amer: >60 mL/min (ref >60)
GFR calc non Af Amer: >60 mL/min (ref >60)
Glucose, Bld: 89 mg/dL (ref 70–99)
Potassium: 4 mmol/L (ref 3.5–5.1)
Sodium: 137 mmol/L (ref 135–145)

## 2019-12-05 LAB — SARS CORONAVIRUS 2 (TAT 6-24 HRS): SARS Coronavirus 2: NEGATIVE

## 2019-12-06 DIAGNOSIS — M545 Low back pain: Secondary | ICD-10-CM | POA: Diagnosis not present

## 2019-12-06 DIAGNOSIS — M79643 Pain in unspecified hand: Secondary | ICD-10-CM | POA: Diagnosis not present

## 2019-12-06 DIAGNOSIS — M25519 Pain in unspecified shoulder: Secondary | ICD-10-CM | POA: Diagnosis not present

## 2019-12-06 DIAGNOSIS — M542 Cervicalgia: Secondary | ICD-10-CM | POA: Diagnosis not present

## 2019-12-06 DIAGNOSIS — Z79891 Long term (current) use of opiate analgesic: Secondary | ICD-10-CM | POA: Diagnosis not present

## 2019-12-06 DIAGNOSIS — M25562 Pain in left knee: Secondary | ICD-10-CM | POA: Diagnosis not present

## 2019-12-07 ENCOUNTER — Ambulatory Visit (HOSPITAL_COMMUNITY): Payer: Medicare Other | Admitting: Anesthesiology

## 2019-12-07 ENCOUNTER — Encounter (HOSPITAL_COMMUNITY): Payer: Self-pay | Admitting: Gastroenterology

## 2019-12-07 ENCOUNTER — Encounter (HOSPITAL_COMMUNITY)
Admission: RE | Disposition: A | Discharge status: Home / Self Care | Payer: Self-pay | Source: Home / Self Care | Attending: Gastroenterology

## 2019-12-07 ENCOUNTER — Other Ambulatory Visit: Payer: Self-pay

## 2019-12-07 ENCOUNTER — Ambulatory Visit (HOSPITAL_COMMUNITY)
Admission: RE | Admit: 2019-12-07 | Discharge: 2019-12-07 | Disposition: A | Discharge status: Home / Self Care | Payer: Medicare Other | Attending: Gastroenterology | Admitting: Gastroenterology

## 2019-12-07 DIAGNOSIS — F17210 Nicotine dependence, cigarettes, uncomplicated: Secondary | ICD-10-CM | POA: Diagnosis not present

## 2019-12-07 DIAGNOSIS — K219 Gastro-esophageal reflux disease without esophagitis: Secondary | ICD-10-CM | POA: Diagnosis not present

## 2019-12-07 DIAGNOSIS — Z86010 Personal history of colonic polyps: Secondary | ICD-10-CM | POA: Insufficient documentation

## 2019-12-07 DIAGNOSIS — Z880 Allergy status to penicillin: Secondary | ICD-10-CM | POA: Insufficient documentation

## 2019-12-07 DIAGNOSIS — K5730 Diverticulosis of large intestine without perforation or abscess without bleeding: Secondary | ICD-10-CM | POA: Diagnosis not present

## 2019-12-07 DIAGNOSIS — I509 Heart failure, unspecified: Secondary | ICD-10-CM | POA: Diagnosis not present

## 2019-12-07 DIAGNOSIS — Z79899 Other long term (current) drug therapy: Secondary | ICD-10-CM | POA: Diagnosis not present

## 2019-12-07 DIAGNOSIS — F329 Major depressive disorder, single episode, unspecified: Secondary | ICD-10-CM | POA: Diagnosis not present

## 2019-12-07 DIAGNOSIS — D122 Benign neoplasm of ascending colon: Secondary | ICD-10-CM

## 2019-12-07 DIAGNOSIS — J449 Chronic obstructive pulmonary disease, unspecified: Secondary | ICD-10-CM | POA: Insufficient documentation

## 2019-12-07 DIAGNOSIS — K635 Polyp of colon: Secondary | ICD-10-CM | POA: Diagnosis not present

## 2019-12-07 DIAGNOSIS — K5520 Angiodysplasia of colon without hemorrhage: Secondary | ICD-10-CM | POA: Diagnosis not present

## 2019-12-07 DIAGNOSIS — Z1211 Encounter for screening for malignant neoplasm of colon: Secondary | ICD-10-CM | POA: Insufficient documentation

## 2019-12-07 DIAGNOSIS — Z09 Encounter for follow-up examination after completed treatment for conditions other than malignant neoplasm: Secondary | ICD-10-CM

## 2019-12-07 DIAGNOSIS — M1711 Unilateral primary osteoarthritis, right knee: Secondary | ICD-10-CM | POA: Diagnosis not present

## 2019-12-07 DIAGNOSIS — I110 Hypertensive heart disease with heart failure: Secondary | ICD-10-CM | POA: Insufficient documentation

## 2019-12-07 DIAGNOSIS — K648 Other hemorrhoids: Secondary | ICD-10-CM | POA: Diagnosis not present

## 2019-12-07 HISTORY — PX: COLONOSCOPY WITH PROPOFOL: SHX5780

## 2019-12-07 HISTORY — PX: POLYPECTOMY: SHX5525

## 2019-12-07 LAB — HM COLONOSCOPY

## 2019-12-07 SURGERY — COLONOSCOPY WITH PROPOFOL
Anesthesia: General

## 2019-12-07 MED ORDER — STERILE WATER FOR IRRIGATION IR SOLN
Status: DC | PRN
  Administered 2019-12-07: 1.5 mL

## 2019-12-07 MED ORDER — ONDANSETRON HCL 4 MG/2ML IJ SOLN
4.0000 mg | Freq: Once | INTRAMUSCULAR | Status: AC
  Administered 2019-12-07: 4 mg via INTRAVENOUS

## 2019-12-07 MED ORDER — ALBUTEROL SULFATE (2.5 MG/3ML) 0.083% IN NEBU
INHALATION_SOLUTION | RESPIRATORY_TRACT | Status: AC
  Filled 2019-12-07: qty 3

## 2019-12-07 MED ORDER — LIDOCAINE HCL (CARDIAC) PF 50 MG/5ML IV SOSY
PREFILLED_SYRINGE | INTRAVENOUS | Status: DC | PRN
  Administered 2019-12-07: 100 mg via INTRAVENOUS

## 2019-12-07 MED ORDER — LACTATED RINGERS IV SOLN: INTRAVENOUS | Status: DC | PRN

## 2019-12-07 MED ORDER — ONDANSETRON HCL 4 MG/2ML IJ SOLN
INTRAMUSCULAR | Status: AC
  Filled 2019-12-07: qty 2

## 2019-12-07 MED ORDER — PROPOFOL 10 MG/ML IV BOLUS
INTRAVENOUS | Status: DC | PRN
  Administered 2019-12-07: 50 mg via INTRAVENOUS
  Administered 2019-12-07: 100 mg via INTRAVENOUS

## 2019-12-07 MED ORDER — IPRATROPIUM-ALBUTEROL 0.5-2.5 (3) MG/3ML IN SOLN
3.0000 mL | Freq: Four times a day (QID) | RESPIRATORY_TRACT | Status: DC | PRN
  Administered 2019-12-07: 3 mL via RESPIRATORY_TRACT

## 2019-12-07 MED ORDER — PROPOFOL 500 MG/50ML IV EMUL
INTRAVENOUS | Status: DC | PRN
  Administered 2019-12-07: 150 ug/kg/min via INTRAVENOUS
  Administered 2019-12-07: 100 ug/kg/min via INTRAVENOUS

## 2019-12-07 MED ORDER — LACTATED RINGERS IV SOLN: Freq: Once | INTRAVENOUS | Status: AC

## 2019-12-07 NOTE — H&P (Signed)
Joseph Ellison is an 64 y.o. male.   Chief Complaint: History of colonic polyps HPI: Joseph Ellison is a 64 y.o. male with PMH HTN, COPD, CHF, GERD, chronic opiate use, depression, who presents to the hospital for evaluation of history of colonic polyps.  The patient had a history of narcotic induced constipation.  He is currently taking Linzess 145 mcg/day.  His bowel movements have improved since then and he denies having any abdominal pain, nausea or vomiting, abdominal distention, diarrhea or any other complaints.  He had a colonoscopy performed in 2017, had polyps found at that time was advised to have a repeat colonoscopy in 3 years but he has not performed this yet.  He denies any history of colorectal cancer in his family.  Past Medical History:  Diagnosis Date  . Arthritis   . Back pain, chronic   . COPD (chronic obstructive pulmonary disease) ([place])   . Depression   . GERD (gastroesophageal reflux disease)   . Hypertension   . MRSA (methicillin resistant Staphylococcus aureus)   . Neurogenic pain, leg   . Primary localized osteoarthritis of right knee 12/28/2016    Past Surgical History:  Procedure Laterality Date  . ELBOW SURGERY    . NOSE SURGERY    . PARTIAL KNEE ARTHROPLASTY Right 12/28/2016   Procedure: UNICOMPARTMENTAL RIGHT KNEE;  Surgeon: Marchia Bond, MD;  Location: [facility];  Service: Orthopedics;  Laterality: Right;    History reviewed. No pertinent family history. Social History:  reports that he has been smoking. He has been smoking about 0.25 packs per day. He has never used smokeless tobacco. He reports previous alcohol use. He reports that he does not use drugs.  Allergies:  Allergies  Allergen Reactions  . Penicillins Hives    Ok with Ancef    Medications Prior to Admission  Medication Sig Dispense Refill  . albuterol (PROVENTIL) (2.5 MG/3ML) 0.083% nebulizer solution Take 2.5 mg by nebulization every 6 (six) hours as needed for wheezing or  shortness of breath.    . budesonide-formoterol (SYMBICORT) 160-4.5 MCG/ACT inhaler Inhale 2 puffs into the lungs 2 (two) times daily as needed (respiratory issues.).     Marland Kitchen buprenorphine (SUBUTEX) 8 MG SUBL SL tablet Place 8 mg under the tongue daily.    . carvedilol (COREG) 6.25 MG tablet Take 6.25 mg by mouth 2 (two) times daily.    . diazepam (VALIUM) 5 MG tablet Take 1 tablet (5 mg total) by mouth 3 (three) times daily. (Patient taking differently: Take 5 mg by mouth every 12 (twelve) hours. anxiety) 30 tablet 0  . DULoxetine (CYMBALTA) 60 MG capsule Take 60 mg by mouth daily.    . finasteride (PROSCAR) 5 MG tablet Take 5 mg by mouth daily.    . fluticasone (FLONASE) 50 MCG/ACT nasal spray Place 1 spray into both nostrils daily.    . furosemide (LASIX) 40 MG tablet Take 40 mg by mouth daily.     Marland Kitchen linaclotide (LINZESS) 145 MCG CAPS capsule Take 1 capsule (145 mcg total) by mouth daily. 30 capsule 5  . lisinopril (ZESTRIL) 10 MG tablet Take 10 mg by mouth 2 (two) times daily.    Marland Kitchen omeprazole (PRILOSEC) 40 MG capsule Take 40 mg by mouth in the morning and at bedtime.     . ondansetron (ZOFRAN) 4 MG tablet Take 1 tablet (4 mg total) by mouth every 8 (eight) hours as needed for nausea or vomiting. (Patient taking differently: Take 4 mg by mouth  2 (two) times daily as needed for nausea or vomiting. ) 30 tablet 0  . OVER THE COUNTER MEDICATION Take 1 capsule by mouth daily. Nugenix Supplement    . oxyCODONE-acetaminophen (PERCOCET) 10-325 MG tablet Take 1 tablet by mouth every 4 (four) hours as needed for pain. (Patient taking differently: Take 1 tablet by mouth 2 (two) times daily as needed for pain (pain.). ) 50 tablet 0  . pravastatin (PRAVACHOL) 40 MG tablet Take 40 mg by mouth daily.     . sildenafil (VIAGRA) 100 MG tablet Take 1 tablet (100 mg total) by mouth daily as needed for erectile dysfunction. 15 tablet 11  . tamsulosin (FLOMAX) 0.4 MG CAPS capsule Take 1 capsule (0.4 mg total) by mouth  daily. 30 capsule 5  . tetrahydrozoline 0.05 % ophthalmic solution Place 1-2 drops into both eyes 3 (three) times daily as needed (dry/irritated eyes.).     Marland Kitchen traZODone (DESYREL) 100 MG tablet Take 100 mg by mouth at bedtime.       Results for orders placed or performed during the hospital encounter of 12/05/19 (from the past 48 hour(s))  SARS CORONAVIRUS 2 (TAT 6-24 HRS) Nasopharyngeal Nasopharyngeal Swab     Status: None   Collection Time: 12/05/19 10:24 AM   Specimen: Nasopharyngeal Swab  Result Value Ref Range   SARS Coronavirus 2 NEGATIVE NEGATIVE    Comment: (NOTE) SARS-CoV-2 target nucleic acids are NOT DETECTED.  The SARS-CoV-2 RNA is generally detectable in upper and lower respiratory specimens during the acute phase of infection. Negative results do not preclude SARS-CoV-2 infection, do not rule out co-infections with other pathogens, and should not be used as the sole basis for treatment or other patient management decisions. Negative results must be combined with clinical observations, patient history, and epidemiological information. The expected result is Negative.  Fact Sheet for Patients: SugarRoll.be  Fact Sheet for Healthcare Providers: https://www.woods-mathews.com/  This test is not yet approved or cleared by the [place] FDA and  has been authorized for detection and/or diagnosis of SARS-CoV-2 by FDA under an Emergency Use Authorization (EUA). This EUA will remain  in effect (meaning this test can be used) for the duration of the COVID-19 declaration under Se ction 564(b)(1) of the Act, 21 U.S.C. section 360bbb-3(b)(1), unless the authorization is terminated or revoked sooner.  Performed at [facility], [place] [address], [place], [place] [address]   Basic metabolic panel     Status: None   Collection Time: 12/05/19 10:54 AM  Result Value Ref Range   Sodium 137 135 - 145 mmol/L   Potassium 4.0 3.5 - 5.1  mmol/L   Chloride 98 98 - 111 mmol/L   CO2 28 22 - 32 mmol/L   Glucose, Bld 89 70 - 99 mg/dL    Comment: Glucose reference range applies only to samples taken after fasting for at least 8 hours.   BUN 12 8 - 23 mg/dL   Creatinine, Ser 0.64 0.61 - 1.24 mg/dL   Calcium 9.3 8.9 - 10.3 mg/dL   GFR calc non Af Amer >60 >60 mL/min   GFR calc Af Amer >60 >60 mL/min   Anion gap 11 5 - 15    Comment: Performed at [facility], [address], [place], [place], [place] [address]   No results found.  Review of Systems  Constitutional: Negative.   HENT: Negative.   Eyes: Negative.   Respiratory: Negative.   Cardiovascular: Negative.   Gastrointestinal: Negative.   Endocrine: Negative.  Genitourinary: Negative.   Musculoskeletal: Negative.   Skin: Negative.   Allergic/Immunologic: Negative.   Neurological: Negative.   Hematological: Negative.   Psychiatric/Behavioral: Negative.     Blood pressure 114/71, temperature 98.3 F (36.8 C), temperature source Oral, resp. rate 11, height 5\' 9"  (1.753 m), weight 95.7 kg, SpO2 93 %. Physical Exam  GENERAL: The patient is AO x3, in no acute distress. HEENT: Head is normocephalic and atraumatic. EOMI are intact. Mouth is well hydrated and without lesions. NECK: Supple. No masses LUNGS: Clear to auscultation. No presence of rhonchi/wheezing/rales. Adequate chest expansion HEART: RRR, normal s1 and s2. ABDOMEN: Soft, nontender, no guarding, no peritoneal signs, and nondistended. BS +. No masses. EXTREMITIES: Without any cyanosis, clubbing, rash, lesions or edema. NEUROLOGIC: AOx3, no focal motor deficit. SKIN: no jaundice, no rashes  Assessment/Plan Joseph Ellison is a 64 y.o. male with PMH HTN, COPD, CHF, GERD, chronic opiate use complicated by constipation, depression, who presents to the hospital for evaluation of history of colonic polyps.  We will proceed with colonoscopy today.  Harvel Quale, MD 12/07/2019, 9:57 AM

## 2019-12-07 NOTE — Addendum Note (Signed)
Addendum  created 12/07/19 1046 by Denese Killings, MD   Order list changed

## 2019-12-07 NOTE — Anesthesia Procedure Notes (Signed)
Date/Time: 12/07/2019 9:54 AM Performed by: Vista Deck, CRNA Pre-anesthesia Checklist: Patient identified, Emergency Drugs available, Suction available, Timeout performed and Patient being monitored Patient Re-evaluated:Patient Re-evaluated prior to induction Oxygen Delivery Method: Nasal Cannula

## 2019-12-07 NOTE — Op Note (Signed)
[facility] Patient Name: Joseph Ellison Procedure Date: 12/07/2019 9:42 AM MRN: [id number] Date of Birth: 05-Aug-1955 Attending MD: Maylon Peppers ,  CSN: [id number] Age: 64 Admit Type: Outpatient Procedure:                Colonoscopy Indications:              High risk colon cancer surveillance: Personal                            history of colonic polyps Providers:                Maylon Peppers, Charlsie Quest. Theda Sers RN, RN, Crystal                            Page, Casimer Bilis, Technician, Raphael Gibney Tech., Technician Referring MD:              Medicines:                Monitored Anesthesia Care Complications:            No immediate complications. Estimated Blood Loss:     Estimated blood loss: none. Procedure:                Pre-Anesthesia Assessment:                           - Prior to the procedure, a History and Physical                            was performed, and patient medications, allergies                            and sensitivities were reviewed. The patient's                            tolerance of previous anesthesia was reviewed.                           - The risks and benefits of the procedure and the                            sedation options and risks were discussed with the                            patient. All questions were answered and informed                            consent was obtained.                           - ASA Grade Assessment: II - A patient with mild                            systemic disease.  After obtaining informed consent, the colonoscope                            was passed under direct vision. Throughout the                            procedure, the patient's blood pressure, pulse, and                            oxygen saturations were monitored continuously. The                            [id number]-[id number] ([id number]) scope was introduced through                             the anus and advanced to the the cecum, identified                            by appendiceal orifice and ileocecal valve. The                            colonoscopy was performed without difficulty. The                            patient tolerated the procedure well. The quality                            of the bowel preparation was good. Scope withdrawal                            time was 14 minutes. Scope In: 10:06:00 AM Scope Out: 10:24:21 AM Scope Withdrawal Time: 0 hours 15 minutes 49 seconds  Total Procedure Duration: 0 hours 18 minutes 21 seconds  Findings:      Hemorrhoids were found on perianal exam.      A single small angiodysplastic lesion without bleeding was found in the       cecum.      A 2 mm polyp was found in the ascending colon. The polyp was sessile.       The polyp was removed with a cold biopsy forceps. Resection and       retrieval were complete.      A few large-mouthed diverticula were found in the ascending colon.      Non-bleeding internal hemorrhoids were found during retroflexion. The       hemorrhoids were medium-sized. Impression:               - Hemorrhoids found on perianal exam.                           - A single non-bleeding colonic angiodysplastic                            lesion.                           - One 2 mm  polyp in the ascending colon, removed                            with a cold biopsy forceps. Resected and retrieved.                           - Diverticulosis in the ascending colon.                           - Non-bleeding internal hemorrhoids. Moderate Sedation:      Per Anesthesia Care Recommendation:           - Discharge patient to home (ambulatory).                           - Resume previous diet.                           - Await pathology results.                           - Repeat colonoscopy in 5 years for surveillance. Procedure Code(s):        --- Professional ---                           [phone], GC,  Colonoscopy, flexible; with biopsy,                            single or multiple Diagnosis Code(s):        --- Professional ---                           Z86.010, Personal history of colonic polyps                           K64.8, Other hemorrhoids                           K63.5, Polyp of colon                           K57.30, Diverticulosis of large intestine without                            perforation or abscess without bleeding CPT copyright 2019 American Medical Association. All rights reserved. The codes documented in this report are preliminary and upon coder review may  be revised to meet current compliance requirements. Maylon Peppers, MD Maylon Peppers,  12/07/2019 10:30:19 AM This report has been signed electronically. Number of Addenda: 0

## 2019-12-07 NOTE — Anesthesia Preprocedure Evaluation (Signed)
Anesthesia Evaluation  Patient identified by MRN, date of birth, ID band Patient awake    Reviewed: Allergy & Precautions, NPO status , Patient's Chart, lab work & pertinent test results, reviewed documented beta blocker date and time   History of Anesthesia Complications Negative for: history of anesthetic complications  Airway Mallampati: III  TM Distance: >3 FB Neck ROM: Full    Dental   Pulmonary sleep apnea (?) , COPD,  COPD inhaler, Current Smoker and Patient abstained from smoking.,    Pulmonary exam normal breath sounds clear to auscultation       Cardiovascular Exercise Tolerance: Good hypertension, Pt. on medications and Pt. on home beta blockers Normal cardiovascular exam Rhythm:Regular Rate:Normal     Neuro/Psych PSYCHIATRIC DISORDERS Depression    GI/Hepatic GERD  Medicated,  Endo/Other    Renal/GU      Musculoskeletal  (+) Arthritis ,   Abdominal   Peds  Hematology   Anesthesia Other Findings   Reproductive/Obstetrics                             Anesthesia Physical Anesthesia Plan  ASA: III  Anesthesia Plan: General   Post-op Pain Management:    Induction: Intravenous  PONV Risk Score and Plan: TIVA  Airway Management Planned: Nasal Cannula and Natural Airway  Additional Equipment:   Intra-op Plan:   Post-operative Plan:   Informed Consent: I have reviewed the patients History and Physical, chart, labs and discussed the procedure including the risks, benefits and alternatives for the proposed anesthesia with the patient or authorized representative who has indicated his/her understanding and acceptance.     Dental advisory given  Plan Discussed with: CRNA and Surgeon  Anesthesia Plan Comments:         Anesthesia Quick Evaluation

## 2019-12-07 NOTE — Discharge Instructions (Signed)
You are being discharged to home.  Resume your previous diet.  We are waiting for your pathology results.  Your physician has recommended a repeat colonoscopy in five years for surveillance.      Colonoscopy, Adult, Care After This sheet gives you information about how to care for yourself after your procedure. Your doctor may also give you more specific instructions. If you have problems or questions, call your doctor. What can I expect after the procedure? After the procedure, it is common to have:  A small amount of blood in your poop (stool) for 24 hours.  Some gas.  Mild cramping or bloating in your belly (abdomen). Follow these instructions at home: Eating and drinking   Drink enough fluid to keep your pee (urine) pale yellow.  Follow instructions from your doctor about what you cannot eat or drink.  Return to your normal diet as told by your doctor. Avoid heavy or fried foods that are hard to digest. Activity  Rest as told by your doctor.  Do not sit for a long time without moving. Get up to take short walks every 1-2 hours. This is important. Ask for help if you feel weak or unsteady.  Return to your normal activities as told by your doctor. Ask your doctor what activities are safe for you. To help cramping and bloating:   Try walking around.  Put heat on your belly as told by your doctor. Use the heat source that your doctor recommends, such as a moist heat pack or a heating pad. ? Put a towel between your skin and the heat source. ? Leave the heat on for 20-30 minutes. ? Remove the heat if your skin turns bright red. This is very important if you are unable to feel pain, heat, or cold. You may have a greater risk of getting burned. General instructions  For the first 24 hours after the procedure: ? Do not drive or use machinery. ? Do not sign important documents. ? Do not drink alcohol. ? Do your daily activities more slowly than normal. ? Eat foods that  are soft and easy to digest.  Take over-the-counter or prescription medicines only as told by your doctor.  Keep all follow-up visits as told by your doctor. This is important. Contact a doctor if:  You have blood in your poop 2-3 days after the procedure. Get help right away if:  You have more than a small amount of blood in your poop.  You see large clumps of tissue (blood clots) in your poop.  Your belly is swollen.  You feel like you may vomit (nauseous).  You vomit.  You have a fever.  You have belly pain that gets worse, and medicine does not help your pain. Summary  After the procedure, it is common to have a small amount of blood in your poop. You may also have mild cramping and bloating in your belly.  For the first 24 hours after the procedure, do not drive or use machinery, do not sign important documents, and do not drink alcohol.  Get help right away if you have a lot of blood in your poop, feel like you may vomit, have a fever, or have more belly pain. This information is not intended to replace advice given to you by your health care provider. Make sure you discuss any questions you have with your health care provider. Document Revised: 09/18/2018 Document Reviewed: 09/18/2018 Elsevier Patient Education  [place].  Monitored Anesthesia Care, Care After These instructions provide you with information about caring for yourself after your procedure. Your health care provider may also give you more specific instructions. Your treatment has been planned according to current medical practices, but problems sometimes occur. Call your health care provider if you have any problems or questions after your procedure. What can I expect after the procedure? After your procedure, you may:  Feel sleepy for several hours.  Feel clumsy and have poor balance for several hours.  Feel forgetful about what happened after the procedure.  Have poor judgment for  several hours.  Feel nauseous or vomit.  Have a sore throat if you had a breathing tube during the procedure. Follow these instructions at home: For at least 24 hours after the procedure:      Have a responsible adult stay with you. It is important to have someone help care for you until you are awake and alert.  Rest as needed.  Do not: ? Participate in activities in which you could fall or become injured. ? Drive. ? Use heavy machinery. ? Drink alcohol. ? Take sleeping pills or medicines that cause drowsiness. ? Make important decisions or sign legal documents. ? Take care of children on your own. Eating and drinking  Follow the diet that is recommended by your health care provider.  If you vomit, drink water, juice, or soup when you can drink without vomiting.  Make sure you have little or no nausea before eating solid foods. General instructions  Take over-the-counter and prescription medicines only as told by your health care provider.  If you have sleep apnea, surgery and certain medicines can increase your risk for breathing problems. Follow instructions from your health care provider about wearing your sleep device: ? Anytime you are sleeping, including during daytime naps. ? While taking prescription pain medicines, sleeping medicines, or medicines that make you drowsy.  If you smoke, do not smoke without supervision.  Keep all follow-up visits as told by your health care provider. This is important. Contact a health care provider if:  You keep feeling nauseous or you keep vomiting.  You feel light-headed.  You develop a rash.  You have a fever. Get help right away if:  You have trouble breathing. Summary  For several hours after your procedure, you may feel sleepy and have poor judgment.  Have a responsible adult stay with you for at least 24 hours or until you are awake and alert. This information is not intended to replace advice given to you by  your health care provider. Make sure you discuss any questions you have with your health care provider. Document Revised: 05/23/2017 Document Reviewed: 06/15/2015 Elsevier Patient Education  [place].   Colon Polyps  Polyps are tissue growths inside the body. Polyps can grow in many places, including the large intestine (colon). A polyp may be a round bump or a mushroom-shaped growth. You could have one polyp or several. Most colon polyps are noncancerous (benign). However, some colon polyps can become cancerous over time. Finding and removing the polyps early can help prevent this. What are the causes? The exact cause of colon polyps is not known. What increases the risk? You are more likely to develop this condition if you:  Have a family history of colon cancer or colon polyps.  Are older than 35 or older than 45 if you are African American.  Have inflammatory bowel disease, such as ulcerative colitis or Crohn's disease.  Have certain hereditary conditions, such as: ? Familial adenomatous polyposis. ? Lynch syndrome. ? Turcot syndrome. ? Peutz-Jeghers syndrome.  Are overweight.  Smoke cigarettes.  Do not get enough exercise.  Drink too much alcohol.  Eat a diet that is high in fat and red meat and low in fiber.  Had childhood cancer that was treated with abdominal radiation. What are the signs or symptoms? Most polyps do not cause symptoms. If you have symptoms, they may include:  Blood coming from your rectum when having a bowel movement.  Blood in your stool. The stool may look dark red or black.  Abdominal pain.  A change in bowel habits, such as constipation or diarrhea. How is this diagnosed? This condition is diagnosed with a colonoscopy. This is a procedure in which a lighted, flexible scope is inserted into the anus and then passed into the colon to examine the area. Polyps are sometimes found when a colonoscopy is done as part of routine cancer  screening tests. How is this treated? Treatment for this condition involves removing any polyps that are found. Most polyps can be removed during a colonoscopy. Those polyps will then be tested for cancer. Additional treatment may be needed depending on the results of testing. Follow these instructions at home: Lifestyle  Maintain a healthy weight, or lose weight if recommended by your health care provider.  Exercise every day or as told by your health care provider.  Do not use any products that contain nicotine or tobacco, such as cigarettes and e-cigarettes. If you need help quitting, ask your health care provider.  If you drink alcohol, limit how much you have: ? 0-1 drink a day for women. ? 0-2 drinks a day for men.  Be aware of how much alcohol is in your drink. In the [place] one drink equals one 12 oz bottle of beer (355 mL), one 5 oz glass of wine (148 mL), or one 1 oz shot of hard liquor (44 mL). Eating and drinking   Eat foods that are high in fiber, such as fruits, vegetables, and whole grains.  Eat foods that are high in calcium and vitamin D, such as milk, cheese, yogurt, eggs, liver, fish, and broccoli.  Limit foods that are high in fat, such as fried foods and desserts.  Limit the amount of red meat and processed meat you eat, such as hot dogs, sausage, bacon, and lunch meats. General instructions  Keep all follow-up visits as told by your health care provider. This is important. ? This includes having regularly scheduled colonoscopies. ? Talk to your health care provider about when you need a colonoscopy. Contact a health care provider if:  You have new or worsening bleeding during a bowel movement.  You have new or increased blood in your stool.  You have a change in bowel habits.  You lose weight for no known reason. Summary  Polyps are tissue growths inside the body. Polyps can grow in many places, including the colon.  Most colon polyps are  noncancerous (benign), but some can become cancerous over time.  This condition is diagnosed with a colonoscopy.  Treatment for this condition involves removing any polyps that are found. Most polyps can be removed during a colonoscopy. This information is not intended to replace advice given to you by your health care provider. Make sure you discuss any questions you have with your health care provider. Document Revised: 06/09/2017 Document Reviewed: 06/09/2017 Elsevier Patient Education  [place].

## 2019-12-07 NOTE — Anesthesia Postprocedure Evaluation (Signed)
Anesthesia Post Note  Patient: Joseph Ellison  Procedure(s) Performed: COLONOSCOPY WITH PROPOFOL (N/A ) POLYPECTOMY  Patient location during evaluation: PACU Anesthesia Type: General Level of consciousness: awake and alert and patient cooperative Pain management: satisfactory to patient Vital Signs Assessment: post-procedure vital signs reviewed and stable Respiratory status: spontaneous breathing Cardiovascular status: stable Postop Assessment: no apparent nausea or vomiting Anesthetic complications: no   No complications documented.   Last Vitals:  Vitals:   12/07/19 0850 12/07/19 1030  BP: 114/71 119/66  Pulse:  75  Resp: 11 20  Temp: 36.8 C (P) 36.4 C  SpO2: 93% 94%    Last Pain:  Vitals:   12/07/19 1005  TempSrc:   PainSc: 6                  [name]

## 2019-12-07 NOTE — Transfer of Care (Signed)
Immediate Anesthesia Transfer of Care Note  Patient: Joseph Ellison  Procedure(s) Performed: COLONOSCOPY WITH PROPOFOL (N/A ) POLYPECTOMY  Patient Location: PACU  Anesthesia Type:General  Level of Consciousness: awake, alert  and patient cooperative  Airway & Oxygen Therapy: Patient Spontanous Breathing  Post-op Assessment: Report given to RN and Post -op Vital signs reviewed and stable  Post vital signs: Reviewed and stable  Last Vitals:  Vitals Value Taken Time  BP    Temp 97.6   Pulse    Resp 20 12/07/19 1030  SpO2    Vitals shown include unvalidated device data.  Last Pain:  Vitals:   12/07/19 1005  TempSrc:   PainSc: 6       Patients Stated Pain Goal: 7 (15/50/27 1423)  Complications: No complications documented.

## 2019-12-11 LAB — SURGICAL PATHOLOGY

## 2019-12-14 ENCOUNTER — Encounter (HOSPITAL_COMMUNITY): Payer: Self-pay | Admitting: Gastroenterology

## 2019-12-28 ENCOUNTER — Other Ambulatory Visit (HOSPITAL_COMMUNITY): Payer: Self-pay | Admitting: Neurology

## 2019-12-28 DIAGNOSIS — M545 Low back pain, unspecified: Secondary | ICD-10-CM | POA: Diagnosis not present

## 2019-12-28 DIAGNOSIS — M79643 Pain in unspecified hand: Secondary | ICD-10-CM | POA: Diagnosis not present

## 2019-12-28 DIAGNOSIS — M542 Cervicalgia: Secondary | ICD-10-CM | POA: Diagnosis not present

## 2019-12-28 DIAGNOSIS — M25562 Pain in left knee: Secondary | ICD-10-CM | POA: Diagnosis not present

## 2019-12-28 DIAGNOSIS — Z79891 Long term (current) use of opiate analgesic: Secondary | ICD-10-CM | POA: Diagnosis not present

## 2020-01-03 ENCOUNTER — Ambulatory Visit (INDEPENDENT_AMBULATORY_CARE_PROVIDER_SITE_OTHER): Payer: Medicare Other | Admitting: Gastroenterology

## 2020-01-03 ENCOUNTER — Other Ambulatory Visit: Payer: Self-pay

## 2020-01-03 ENCOUNTER — Encounter (INDEPENDENT_AMBULATORY_CARE_PROVIDER_SITE_OTHER): Payer: Self-pay | Admitting: Gastroenterology

## 2020-01-03 VITALS — BP 134/79 | HR 82 | Temp 98.2°F | Ht 69.0 in | Wt 215.9 lb

## 2020-01-03 DIAGNOSIS — K5909 Other constipation: Secondary | ICD-10-CM | POA: Diagnosis not present

## 2020-01-03 DIAGNOSIS — K219 Gastro-esophageal reflux disease without esophagitis: Secondary | ICD-10-CM | POA: Diagnosis not present

## 2020-01-03 DIAGNOSIS — Z86010 Personal history of colonic polyps: Secondary | ICD-10-CM

## 2020-01-03 MED ORDER — LINACLOTIDE 72 MCG PO CAPS: 72.0000 ug | ORAL_CAPSULE | Freq: Every day | ORAL | 3 refills | Status: DC

## 2020-01-03 MED ORDER — OMEPRAZOLE 40 MG PO CPDR: 40.0000 mg | DELAYED_RELEASE_CAPSULE | Freq: Every day | ORAL | 3 refills | Status: AC

## 2020-01-03 NOTE — Progress Notes (Signed)
Patient profile: Joseph Ellison is a 64 y.o. male seen for evaluation of constipation. Had colonoscopy 12/07/19 as below.    History of Present Illness: Joseph Ellison is seen today for follow up.   He reports having watery stools usually 2-3x in mornings, they are frequently loose and having some occasional accidents due to urgency w/ linzess.  He reports also being on Amitiza in the past but feels Linzess works better overall than Amitiza. Denies blood in stool. No abd pain.   Describes nausea some days - can be worse in AM after taking medicines (often takes on empty stomach). No vomiting. Takes omeprazole 40mg  twice a day - previously was on once a day and denies any symptoms. Unsure why his dose was increased.   He reports a hx of a large polyp on last colonoscopy in [place] 5 years ago.    Smokes 1 week per week. No alcohol. No nsaids frequently.  Wt Readings from Last 3 Encounters:  01/03/20 215 lb 14.4 oz (97.9 kg)  12/07/19 211 lb (95.7 kg)  12/05/19 216 lb (98 kg)     Last Colonoscopy: 12/2019-Small angiodysplasia cecum, 2 mm polyp ascending, diverticulosis. Path with benign colonic mucosa   Past Medical History:  Past Medical History:  Diagnosis Date  . Arthritis   . Back pain, chronic   . COPD (chronic obstructive pulmonary disease) ([place])   . Depression   . GERD (gastroesophageal reflux disease)   . Hypertension   . MRSA (methicillin resistant Staphylococcus aureus)   . Neurogenic pain, leg   . Primary localized osteoarthritis of right knee 12/28/2016    Problem List: Patient Active Problem List   Diagnosis Date Noted  . Constipation 10/01/2019  . Chronic, continuous use of opioids 10/01/2019  . History of colonic polyps 10/01/2019  . Primary localized osteoarthritis of right knee 12/28/2016  . S/P knee replacement 12/28/2016    Past Surgical History: Past Surgical History:  Procedure Laterality Date  . COLONOSCOPY WITH PROPOFOL N/A 12/07/2019    Procedure: COLONOSCOPY WITH PROPOFOL;  Surgeon: Harvel Quale, MD;  [facility];  Service: Gastroenterology;  Laterality: N/A;  930  . ELBOW SURGERY    . NOSE SURGERY    . PARTIAL KNEE ARTHROPLASTY Right 12/28/2016   Procedure: UNICOMPARTMENTAL RIGHT KNEE;  Surgeon: Marchia Bond, MD;  Location: [place];  Service: Orthopedics;  Laterality: Right;  . POLYPECTOMY  12/07/2019   Procedure: POLYPECTOMY;  Surgeon: Harvel Quale, MD;  [facility];  Service: Gastroenterology;;    Allergies: Allergies  Allergen Reactions  . Penicillins Hives    Ok with Ancef      Home Medications:  Current Outpatient Medications:  .  albuterol (PROVENTIL) (2.5 MG/3ML) 0.083% nebulizer solution, Take 2.5 mg by nebulization every 6 (six) hours as needed for wheezing or shortness of breath., Disp: , Rfl:  .  budesonide-formoterol (SYMBICORT) 160-4.5 MCG/ACT inhaler, Inhale 2 puffs into the lungs 2 (two) times daily as needed (respiratory issues.). , Disp: , Rfl:  .  buprenorphine (SUBUTEX) 8 MG SUBL SL tablet, Place 8 mg under the tongue daily., Disp: , Rfl:  .  carvedilol (COREG) 6.25 MG tablet, Take 6.25 mg by mouth 2 (two) times daily., Disp: , Rfl:  .  diazepam (VALIUM) 5 MG tablet, Take 1 tablet (5 mg total) by mouth 3 (three) times daily. (Patient taking differently: Take 5 mg by mouth every 12 (twelve) hours. anxiety), Disp: 30 tablet, Rfl: 0 .  DULoxetine (CYMBALTA) 60 MG capsule, Take 60 mg by mouth daily., Disp: , Rfl:  .  finasteride (PROSCAR) 5 MG tablet, Take 5 mg by mouth daily., Disp: , Rfl:  .  fluticasone (FLONASE) 50 MCG/ACT nasal spray, Place 1 spray into both nostrils daily., Disp: , Rfl:  .  furosemide (LASIX) 40 MG tablet, Take 40 mg by mouth daily. , Disp: , Rfl:  .  lisinopril (ZESTRIL) 10 MG tablet, Take 10 mg by mouth 2 (two) times daily., Disp: , Rfl:  .  ondansetron (ZOFRAN) 4 MG tablet, Take 1 tablet (4 mg total) by mouth every 8 (eight)  hours as needed for nausea or vomiting. (Patient taking differently: Take 4 mg by mouth 2 (two) times daily as needed for nausea or vomiting. ), Disp: 30 tablet, Rfl: 0 .  OVER THE COUNTER MEDICATION, Take 1 capsule by mouth daily. Nugenix Supplement, Disp: , Rfl:  .  oxyCODONE-acetaminophen (PERCOCET) 10-325 MG tablet, Take 1 tablet by mouth every 4 (four) hours as needed for pain. (Patient taking differently: Take 1 tablet by mouth 2 (two) times daily as needed for pain (pain.). ), Disp: 50 tablet, Rfl: 0 .  pravastatin (PRAVACHOL) 40 MG tablet, Take 40 mg by mouth daily. , Disp: , Rfl:  .  sildenafil (VIAGRA) 100 MG tablet, Take 1 tablet (100 mg total) by mouth daily as needed for erectile dysfunction., Disp: 15 tablet, Rfl: 11 .  tamsulosin (FLOMAX) 0.4 MG CAPS capsule, Take 1 capsule (0.4 mg total) by mouth daily., Disp: 30 capsule, Rfl: 5 .  tetrahydrozoline 0.05 % ophthalmic solution, Place 1-2 drops into both eyes 3 (three) times daily as needed (dry/irritated eyes.). , Disp: , Rfl:  .  traZODone (DESYREL) 100 MG tablet, Take 100 mg by mouth at bedtime. , Disp: , Rfl:  .  linaclotide (LINZESS) 72 MCG capsule, Take 1 capsule (72 mcg total) by mouth daily before breakfast., Disp: 30 capsule, Rfl: 3 .  omeprazole (PRILOSEC) 40 MG capsule, Take 1 capsule (40 mg total) by mouth daily., Disp: 90 capsule, Rfl: 3   Family History: family history is not on file.    Social History:   reports that he has been smoking. He has been smoking about 0.25 packs per day. He has never used smokeless tobacco. He reports previous alcohol use. He reports that he does not use drugs.   Review of Systems: Constitutional: Denies weight loss/weight gain  Eyes: No changes in vision. ENT: No oral lesions, sore throat.  GI: see HPI.  Heme/Lymph: No easy bruising.  CV: No chest pain.  GU: No hematuria.  Integumentary: No rashes.  Neuro: No headaches.  Psych: No depression/anxiety.  Endocrine: No heat/cold  intolerance.  Allergic/Immunologic: No urticaria.  Resp: No cough, SOB.  Musculoskeletal: No joint swelling.    Physical Examination: BP 134/79 (BP Location: Right Arm, Patient Position: Sitting, Cuff Size: Large)   Pulse 82   Temp 98.2 F (36.8 C) (Oral)   Ht 5\' 9"  (1.753 m)   Wt 215 lb 14.4 oz (97.9 kg)   BMI 31.88 kg/m  Gen: NAD, alert and oriented x 4 HEENT: PEERLA, EOMI, Neck: supple, no JVD Chest: CTA bilaterally, no wheezes, crackles, or other adventitious sounds CV: RRR, no m/g/c/r Abd: soft, NT, ND, +BS in all four quadrants; no HSM, guarding, ridigity, or rebound tenderness Ext: no edema, well perfused with 2+ pulses, Skin: no rash or lesions noted on observed skin Lymph: no noted LAD  Data Reviewed:  Assessment/Plan: Mr. Shober is a 64 y.o. male seen for follow-up of constipation  1. Constipation-improved with Linzess 145 mcg but is having some issues with looser stools.  We will try decreasing to 72 mcg, samples provided today.  Reviewed to take 30 min before meals.  He has been on Amitiza in the past but feels Linzess works better.  Recent colonoscopy without structural lesion  2.  History of polyps-due for repeat 2026  3.  GERD-asymptomatic on omeprazole 40 mg twice a day, will decrease to 40 mg once a day.  Follow-up 1 year-call sooner if medication adjustments do not improve symptoms   Taylon was seen today for follow-up.  Diagnoses and all orders for this visit:  Chronic GERD  Chronic constipation  Personal history of colonic polyps  Other orders -     omeprazole (PRILOSEC) 40 MG capsule; Take 1 capsule (40 mg total) by mouth daily. -     linaclotide (LINZESS) 72 MCG capsule; Take 1 capsule (72 mcg total) by mouth daily before breakfast.     I personally performed the service, non-incident to. (WP)  Laurine Blazer, [facility]

## 2020-01-03 NOTE — Patient Instructions (Signed)
Decrease omeprazole to 40 mg once a day instead of 40 mg twice a day.  We are starting Linzess 24mcg lower dose-take Linzess 30 minutes before meals.  Stop Linzess 145 mcg dose when starting the lower dose

## 2020-01-08 ENCOUNTER — Other Ambulatory Visit: Payer: Self-pay

## 2020-01-08 ENCOUNTER — Ambulatory Visit (HOSPITAL_COMMUNITY)
Admission: RE | Admit: 2020-01-08 | Discharge: 2020-01-08 | Disposition: A | Discharge status: Home / Self Care | Payer: Medicare Other | Source: Ambulatory Visit | Attending: Neurology | Admitting: Neurology

## 2020-01-08 DIAGNOSIS — M545 Low back pain, unspecified: Secondary | ICD-10-CM

## 2020-02-04 DIAGNOSIS — I5042 Chronic combined systolic (congestive) and diastolic (congestive) heart failure: Secondary | ICD-10-CM | POA: Diagnosis not present

## 2020-02-04 DIAGNOSIS — K5903 Drug induced constipation: Secondary | ICD-10-CM | POA: Diagnosis not present

## 2020-02-04 DIAGNOSIS — J410 Simple chronic bronchitis: Secondary | ICD-10-CM | POA: Diagnosis not present

## 2020-02-04 DIAGNOSIS — Z23 Encounter for immunization: Secondary | ICD-10-CM | POA: Diagnosis not present

## 2020-02-04 DIAGNOSIS — R0981 Nasal congestion: Secondary | ICD-10-CM | POA: Diagnosis not present

## 2020-02-07 ENCOUNTER — Encounter (INDEPENDENT_AMBULATORY_CARE_PROVIDER_SITE_OTHER): Payer: Self-pay | Admitting: *Deleted

## 2020-02-21 ENCOUNTER — Other Ambulatory Visit (HOSPITAL_COMMUNITY): Payer: Self-pay | Admitting: Neurology

## 2020-02-21 ENCOUNTER — Other Ambulatory Visit: Payer: Self-pay | Admitting: Neurology

## 2020-02-21 DIAGNOSIS — M25562 Pain in left knee: Secondary | ICD-10-CM | POA: Diagnosis not present

## 2020-02-21 DIAGNOSIS — M542 Cervicalgia: Secondary | ICD-10-CM | POA: Diagnosis not present

## 2020-02-21 DIAGNOSIS — M79643 Pain in unspecified hand: Secondary | ICD-10-CM | POA: Diagnosis not present

## 2020-02-21 DIAGNOSIS — M545 Low back pain, unspecified: Secondary | ICD-10-CM | POA: Diagnosis not present

## 2020-02-21 DIAGNOSIS — Z79891 Long term (current) use of opiate analgesic: Secondary | ICD-10-CM | POA: Diagnosis not present

## 2020-04-17 DIAGNOSIS — I10 Essential (primary) hypertension: Secondary | ICD-10-CM | POA: Diagnosis not present

## 2020-04-17 DIAGNOSIS — I5032 Chronic diastolic (congestive) heart failure: Secondary | ICD-10-CM | POA: Diagnosis not present

## 2020-04-17 DIAGNOSIS — R079 Chest pain, unspecified: Secondary | ICD-10-CM | POA: Diagnosis not present

## 2020-04-17 DIAGNOSIS — E785 Hyperlipidemia, unspecified: Secondary | ICD-10-CM | POA: Diagnosis not present

## 2020-05-02 DIAGNOSIS — I10 Essential (primary) hypertension: Secondary | ICD-10-CM | POA: Diagnosis not present

## 2020-05-02 DIAGNOSIS — E785 Hyperlipidemia, unspecified: Secondary | ICD-10-CM | POA: Diagnosis not present

## 2020-05-02 DIAGNOSIS — R079 Chest pain, unspecified: Secondary | ICD-10-CM | POA: Diagnosis not present

## 2020-05-02 DIAGNOSIS — I5032 Chronic diastolic (congestive) heart failure: Secondary | ICD-10-CM | POA: Diagnosis not present

## 2020-05-05 NOTE — Progress Notes (Incomplete)
History of Present Illness: ***  Past Medical History:  Diagnosis Date  . Arthritis   . Back pain, chronic   . COPD (chronic obstructive pulmonary disease) ([place])   . Depression   . GERD (gastroesophageal reflux disease)   . Hypertension   . MRSA (methicillin resistant Staphylococcus aureus)   . Neurogenic pain, leg   . Primary localized osteoarthritis of right knee 12/28/2016    Past Surgical History:  Procedure Laterality Date  . COLONOSCOPY WITH PROPOFOL N/A 12/07/2019   Procedure: COLONOSCOPY WITH PROPOFOL;  Surgeon: Harvel Quale, MD;  [facility];  Service: Gastroenterology;  Laterality: N/A;  930  . ELBOW SURGERY    . NOSE SURGERY    . PARTIAL KNEE ARTHROPLASTY Right 12/28/2016   Procedure: UNICOMPARTMENTAL RIGHT KNEE;  Surgeon: Marchia Bond, MD;  Location: [place];  Service: Orthopedics;  Laterality: Right;  . POLYPECTOMY  12/07/2019   Procedure: POLYPECTOMY;  Surgeon: Harvel Quale, MD;  [facility];  Service: Gastroenterology;;    Home Medications:  Allergies as of 05/06/2020      Reactions   Penicillins Hives   Ok with Ancef      Medication List       Accurate as of May 05, 2020  9:14 PM. If you have any questions, ask your nurse or doctor.        albuterol (2.5 MG/3ML) 0.083% nebulizer solution Commonly known as: PROVENTIL Take 2.5 mg by nebulization every 6 (six) hours as needed for wheezing or shortness of breath.   budesonide-formoterol 160-4.5 MCG/ACT inhaler Commonly known as: SYMBICORT Inhale 2 puffs into the lungs 2 (two) times daily as needed (respiratory issues.).   buprenorphine 8 MG Subl SL tablet Commonly known as: SUBUTEX Place 8 mg under the tongue daily.   carvedilol 6.25 MG tablet Commonly known as: COREG Take 6.25 mg by mouth 2 (two) times daily.   diazepam 5 MG tablet Commonly known as: VALIUM Take 1 tablet (5 mg total) by mouth 3 (three) times daily. What changed:   when to  take this  additional instructions   DULoxetine 60 MG capsule Commonly known as: CYMBALTA Take 60 mg by mouth daily.   finasteride 5 MG tablet Commonly known as: PROSCAR Take 5 mg by mouth daily.   fluticasone 50 MCG/ACT nasal spray Commonly known as: FLONASE Place 1 spray into both nostrils daily.   furosemide 40 MG tablet Commonly known as: LASIX Take 40 mg by mouth daily.   linaclotide 72 MCG capsule Commonly known as: Linzess Take 1 capsule (72 mcg total) by mouth daily before breakfast.   lisinopril 10 MG tablet Commonly known as: ZESTRIL Take 10 mg by mouth 2 (two) times daily.   omeprazole 40 MG capsule Commonly known as: PRILOSEC Take 1 capsule (40 mg total) by mouth daily.   ondansetron 4 MG tablet Commonly known as: Zofran Take 1 tablet (4 mg total) by mouth every 8 (eight) hours as needed for nausea or vomiting. What changed: when to take this   OVER THE COUNTER MEDICATION Take 1 capsule by mouth daily. Nugenix Supplement   oxyCODONE-acetaminophen 10-325 MG tablet Commonly known as: PERCOCET Take 1 tablet by mouth every 4 (four) hours as needed for pain. What changed:   when to take this  reasons to take this   pravastatin 40 MG tablet Commonly known as: PRAVACHOL Take 40 mg by mouth daily.   sildenafil 100 MG tablet Commonly known as: VIAGRA Take 1 tablet (100  mg total) by mouth daily as needed for erectile dysfunction.   tamsulosin 0.4 MG Caps capsule Commonly known as: FLOMAX Take 1 capsule (0.4 mg total) by mouth daily.   tetrahydrozoline 0.05 % ophthalmic solution Place 1-2 drops into both eyes 3 (three) times daily as needed (dry/irritated eyes.).   traZODone 100 MG tablet Commonly known as: DESYREL Take 100 mg by mouth at bedtime.       Allergies:  Allergies  Allergen Reactions  . Penicillins Hives    Ok with Ancef    No family history on file.  Social History:  reports that he has been smoking. He has been smoking  about 0.25 packs per day. He has never used smokeless tobacco. He reports previous alcohol use. He reports that he does not use drugs.  ROS: A complete review of systems was performed.  All systems are negative except for pertinent findings as noted.  Physical Exam:  Vital signs in last 24 hours: There were no vitals taken for this visit. Constitutional:  Alert and oriented, No acute distress Cardiovascular: Regular rate  Respiratory: Normal respiratory effort GI: Abdomen is soft, nontender, nondistended, no abdominal masses. No CVAT.  Genitourinary: Normal male phallus, testes are descended bilaterally and non-tender and without masses, scrotum is normal in appearance without lesions or masses, perineum is normal on inspection. Lymphatic: No lymphadenopathy Neurologic: Grossly intact, no focal deficits Psychiatric: Normal mood and affect   Impression/Assessment:  ***  Plan:  ***

## 2020-05-06 ENCOUNTER — Ambulatory Visit: Payer: Medicare Other | Admitting: Urology

## 2020-05-06 DIAGNOSIS — N521 Erectile dysfunction due to diseases classified elsewhere: Secondary | ICD-10-CM

## 2020-05-12 DIAGNOSIS — Z79891 Long term (current) use of opiate analgesic: Secondary | ICD-10-CM | POA: Diagnosis not present

## 2020-05-12 DIAGNOSIS — M25562 Pain in left knee: Secondary | ICD-10-CM | POA: Diagnosis not present

## 2020-05-12 DIAGNOSIS — K5903 Drug induced constipation: Secondary | ICD-10-CM | POA: Diagnosis not present

## 2020-05-12 DIAGNOSIS — R110 Nausea: Secondary | ICD-10-CM | POA: Diagnosis not present

## 2020-05-12 DIAGNOSIS — M79643 Pain in unspecified hand: Secondary | ICD-10-CM | POA: Diagnosis not present

## 2020-05-12 DIAGNOSIS — G4733 Obstructive sleep apnea (adult) (pediatric): Secondary | ICD-10-CM | POA: Diagnosis not present

## 2020-05-12 DIAGNOSIS — M542 Cervicalgia: Secondary | ICD-10-CM | POA: Diagnosis not present

## 2020-05-12 DIAGNOSIS — M25519 Pain in unspecified shoulder: Secondary | ICD-10-CM | POA: Diagnosis not present

## 2020-05-12 DIAGNOSIS — M545 Low back pain, unspecified: Secondary | ICD-10-CM | POA: Diagnosis not present

## 2020-05-12 DIAGNOSIS — M130 Polyarthritis, unspecified: Secondary | ICD-10-CM | POA: Diagnosis not present

## 2020-05-12 DIAGNOSIS — I10 Essential (primary) hypertension: Secondary | ICD-10-CM | POA: Diagnosis not present

## 2020-07-01 ENCOUNTER — Ambulatory Visit: Payer: Medicare Other | Admitting: Urology

## 2020-07-07 DIAGNOSIS — R110 Nausea: Secondary | ICD-10-CM | POA: Diagnosis not present

## 2020-07-07 DIAGNOSIS — G4733 Obstructive sleep apnea (adult) (pediatric): Secondary | ICD-10-CM | POA: Diagnosis not present

## 2020-07-07 DIAGNOSIS — Z79891 Long term (current) use of opiate analgesic: Secondary | ICD-10-CM | POA: Diagnosis not present

## 2020-07-07 DIAGNOSIS — M79643 Pain in unspecified hand: Secondary | ICD-10-CM | POA: Diagnosis not present

## 2020-07-07 DIAGNOSIS — M542 Cervicalgia: Secondary | ICD-10-CM | POA: Diagnosis not present

## 2020-07-07 DIAGNOSIS — K5903 Drug induced constipation: Secondary | ICD-10-CM | POA: Diagnosis not present

## 2020-07-07 DIAGNOSIS — M545 Low back pain, unspecified: Secondary | ICD-10-CM | POA: Diagnosis not present

## 2020-07-07 DIAGNOSIS — M130 Polyarthritis, unspecified: Secondary | ICD-10-CM | POA: Diagnosis not present

## 2020-07-07 DIAGNOSIS — M25519 Pain in unspecified shoulder: Secondary | ICD-10-CM | POA: Diagnosis not present

## 2020-07-07 DIAGNOSIS — I10 Essential (primary) hypertension: Secondary | ICD-10-CM | POA: Diagnosis not present

## 2020-07-07 DIAGNOSIS — M25562 Pain in left knee: Secondary | ICD-10-CM | POA: Diagnosis not present

## 2020-07-29 DIAGNOSIS — Z13220 Encounter for screening for lipoid disorders: Secondary | ICD-10-CM | POA: Diagnosis not present

## 2020-07-29 DIAGNOSIS — Z0000 Encounter for general adult medical examination without abnormal findings: Secondary | ICD-10-CM | POA: Diagnosis not present

## 2020-07-29 DIAGNOSIS — G894 Chronic pain syndrome: Secondary | ICD-10-CM | POA: Diagnosis not present

## 2020-07-29 DIAGNOSIS — Z5181 Encounter for therapeutic drug level monitoring: Secondary | ICD-10-CM | POA: Diagnosis not present

## 2020-07-29 DIAGNOSIS — R7302 Impaired glucose tolerance (oral): Secondary | ICD-10-CM | POA: Diagnosis not present

## 2020-07-29 DIAGNOSIS — I509 Heart failure, unspecified: Secondary | ICD-10-CM | POA: Diagnosis not present

## 2020-07-29 DIAGNOSIS — R5383 Other fatigue: Secondary | ICD-10-CM | POA: Diagnosis not present

## 2020-07-29 DIAGNOSIS — J410 Simple chronic bronchitis: Secondary | ICD-10-CM | POA: Diagnosis not present

## 2020-09-17 DIAGNOSIS — R110 Nausea: Secondary | ICD-10-CM | POA: Diagnosis not present

## 2020-09-17 DIAGNOSIS — M545 Low back pain, unspecified: Secondary | ICD-10-CM | POA: Diagnosis not present

## 2020-09-17 DIAGNOSIS — K5903 Drug induced constipation: Secondary | ICD-10-CM | POA: Diagnosis not present

## 2020-09-17 DIAGNOSIS — M130 Polyarthritis, unspecified: Secondary | ICD-10-CM | POA: Diagnosis not present

## 2020-09-17 DIAGNOSIS — M79643 Pain in unspecified hand: Secondary | ICD-10-CM | POA: Diagnosis not present

## 2020-09-17 DIAGNOSIS — M542 Cervicalgia: Secondary | ICD-10-CM | POA: Diagnosis not present

## 2020-09-17 DIAGNOSIS — M25519 Pain in unspecified shoulder: Secondary | ICD-10-CM | POA: Diagnosis not present

## 2020-09-17 DIAGNOSIS — I10 Essential (primary) hypertension: Secondary | ICD-10-CM | POA: Diagnosis not present

## 2020-09-17 DIAGNOSIS — M25562 Pain in left knee: Secondary | ICD-10-CM | POA: Diagnosis not present

## 2020-09-17 DIAGNOSIS — G4733 Obstructive sleep apnea (adult) (pediatric): Secondary | ICD-10-CM | POA: Diagnosis not present

## 2020-09-17 DIAGNOSIS — Z79891 Long term (current) use of opiate analgesic: Secondary | ICD-10-CM | POA: Diagnosis not present

## 2020-10-14 DIAGNOSIS — I5032 Chronic diastolic (congestive) heart failure: Secondary | ICD-10-CM | POA: Diagnosis not present

## 2020-10-14 DIAGNOSIS — I10 Essential (primary) hypertension: Secondary | ICD-10-CM | POA: Diagnosis not present

## 2020-10-15 ENCOUNTER — Other Ambulatory Visit (INDEPENDENT_AMBULATORY_CARE_PROVIDER_SITE_OTHER): Payer: Self-pay | Admitting: *Deleted

## 2020-10-15 MED ORDER — LINACLOTIDE 72 MCG PO CAPS: 72.0000 ug | ORAL_CAPSULE | Freq: Every day | ORAL | 3 refills | Status: DC

## 2020-10-19 DIAGNOSIS — G4730 Sleep apnea, unspecified: Secondary | ICD-10-CM | POA: Diagnosis not present

## 2020-10-21 DIAGNOSIS — Z0000 Encounter for general adult medical examination without abnormal findings: Secondary | ICD-10-CM | POA: Diagnosis not present

## 2020-10-21 DIAGNOSIS — E785 Hyperlipidemia, unspecified: Secondary | ICD-10-CM | POA: Diagnosis not present

## 2020-10-21 DIAGNOSIS — G4730 Sleep apnea, unspecified: Secondary | ICD-10-CM | POA: Diagnosis not present

## 2020-10-21 DIAGNOSIS — Z79899 Other long term (current) drug therapy: Secondary | ICD-10-CM | POA: Diagnosis not present

## 2020-10-21 DIAGNOSIS — L509 Urticaria, unspecified: Secondary | ICD-10-CM | POA: Diagnosis not present

## 2020-10-21 DIAGNOSIS — H6690 Otitis media, unspecified, unspecified ear: Secondary | ICD-10-CM | POA: Diagnosis not present

## 2020-10-21 DIAGNOSIS — Z131 Encounter for screening for diabetes mellitus: Secondary | ICD-10-CM | POA: Diagnosis not present

## 2020-11-18 DIAGNOSIS — G4733 Obstructive sleep apnea (adult) (pediatric): Secondary | ICD-10-CM | POA: Diagnosis not present

## 2020-11-18 DIAGNOSIS — M79643 Pain in unspecified hand: Secondary | ICD-10-CM | POA: Diagnosis not present

## 2020-11-18 DIAGNOSIS — K5903 Drug induced constipation: Secondary | ICD-10-CM | POA: Diagnosis not present

## 2020-11-18 DIAGNOSIS — M542 Cervicalgia: Secondary | ICD-10-CM | POA: Diagnosis not present

## 2020-11-18 DIAGNOSIS — M545 Low back pain, unspecified: Secondary | ICD-10-CM | POA: Diagnosis not present

## 2020-11-18 DIAGNOSIS — Z79891 Long term (current) use of opiate analgesic: Secondary | ICD-10-CM | POA: Diagnosis not present

## 2020-11-18 DIAGNOSIS — M25562 Pain in left knee: Secondary | ICD-10-CM | POA: Diagnosis not present

## 2020-11-18 DIAGNOSIS — R110 Nausea: Secondary | ICD-10-CM | POA: Diagnosis not present

## 2020-11-18 DIAGNOSIS — G4700 Insomnia, unspecified: Secondary | ICD-10-CM | POA: Diagnosis not present

## 2020-11-18 DIAGNOSIS — I10 Essential (primary) hypertension: Secondary | ICD-10-CM | POA: Diagnosis not present

## 2020-11-18 DIAGNOSIS — M25519 Pain in unspecified shoulder: Secondary | ICD-10-CM | POA: Diagnosis not present

## 2020-11-18 DIAGNOSIS — M130 Polyarthritis, unspecified: Secondary | ICD-10-CM | POA: Diagnosis not present

## 2020-12-13 DIAGNOSIS — M47896 Other spondylosis, lumbar region: Secondary | ICD-10-CM | POA: Diagnosis not present

## 2021-01-05 ENCOUNTER — Other Ambulatory Visit (INDEPENDENT_AMBULATORY_CARE_PROVIDER_SITE_OTHER): Payer: Self-pay

## 2021-01-05 ENCOUNTER — Ambulatory Visit (INDEPENDENT_AMBULATORY_CARE_PROVIDER_SITE_OTHER): Payer: Medicare Other | Admitting: Gastroenterology

## 2021-01-05 ENCOUNTER — Encounter (INDEPENDENT_AMBULATORY_CARE_PROVIDER_SITE_OTHER): Payer: Self-pay | Admitting: Gastroenterology

## 2021-01-05 ENCOUNTER — Other Ambulatory Visit: Payer: Self-pay

## 2021-01-05 ENCOUNTER — Encounter (INDEPENDENT_AMBULATORY_CARE_PROVIDER_SITE_OTHER): Payer: Self-pay

## 2021-01-05 VITALS — BP 106/61 | HR 70 | Temp 98.1°F | Ht 69.0 in | Wt 220.1 lb

## 2021-01-05 DIAGNOSIS — R110 Nausea: Secondary | ICD-10-CM | POA: Diagnosis not present

## 2021-01-05 DIAGNOSIS — F1190 Opioid use, unspecified, uncomplicated: Secondary | ICD-10-CM

## 2021-01-05 DIAGNOSIS — K5903 Drug induced constipation: Secondary | ICD-10-CM | POA: Diagnosis not present

## 2021-01-05 DIAGNOSIS — R140 Abdominal distension (gaseous): Secondary | ICD-10-CM | POA: Diagnosis not present

## 2021-01-05 NOTE — H&P (View-Only) (Signed)
Maylon Peppers, M.D. Gastroenterology & Hepatology [facility] [address] [place], [place] [address]  Primary Care Physician: Leeanne Rio, MD [place] [id number]  I will communicate my assessment and recommendations to the referring MD via EMR.  Problems: Chronic bloating Opiate induced constipation  History of Present Illness: Joseph Ellison is a 65 y.o. male with PMH HTN, COPD, CHF, GERD, chronic opiate use, depression, who presents for follow up of bloating.  The patient was last seen on 01/03/2020. At that time, the patient endorsed having loose bowel movements and had his Linzess decreased to 72 mcg every day, he had his omeprazole decreased to 40 mg every day for management of GERD.  Patient reports that he has felt intermittent bloating in his abdomen for the last year but this is worsened for the last 3 months. He reports the abdominal bloating causes significant discomfort in his abdomen, he is presenting this on a daily basis but he is concerned as he feels short of breath due to the bloating - this has affected his sleeping pattern. He is also surprised he feels so bloated as he does not eat too often during the day. States taking Gas X helps improving his symptoms but he is not taking this on a daily basis.  He has presented bowel movements every day, but sometimes he skips a dose as he has significant amount of bowel movements with Linzess 72 mcg every day.  Notably, for the last month he has felt recurrent episodes of nausea without vomiting, for which he states he has tried taking an OTC motion sickness medication with some relief.  He has tried a supplement called "balance of nature" for his nausea but felt it made his nausea symptoms worse.  Has not started any new medications otherwise.  He is following a GOLO diet to try to lose some weight but he believes that the bloating episodes have caused  his weight to increase.  States he quit smoking recently, which has helped with his chronic shortness of breath due to COPD.  The patient denies having any vomiting, fever, chills, hematochezia, melena, hematemesis, abdominal pain, diarrhea, jaundice, pruritus. Lost some wieght after he was heavily diuresed but states now he has significant amount of bloating.  As part Of the investigations he has had in the past, he underwent as part of CT abdomen and pelvis with IV contrast 08/23/2019 at [facility].  This was performed for evaluation of abdominal pain and possible melena.  Findings in the report are described below 1. No bowel wall thickening or bowel obstruction. No abscess in the  abdomen or pelvis. Appendix appears normal. Note that there is moderate stool in the colon.  2. There is lipomatous infiltration in the ileocecal valve 3. Hepatic steatosis, a finding that also may be variably associated with abdominal pain.  4. No renal or ureteral calculus on either side. No hydronephrosis on either side. Urinary bladder is midline with wall thickness normal.    Last EGD: never Last Colonoscopy: 12/2019-Small angiodysplasia cecum, 2 mm polyp ascending, diverticulosis. Path with benign colonic mucosa  Past Medical History: Past Medical History:  Diagnosis Date   Arthritis    Back pain, chronic    COPD (chronic obstructive pulmonary disease) ([facility])    Depression    GERD (gastroesophageal reflux disease)    Hypertension    MRSA (methicillin resistant Staphylococcus aureus)    Neurogenic pain, leg  Primary localized osteoarthritis of right knee 12/28/2016    Past Surgical History: Past Surgical History:  Procedure Laterality Date   COLONOSCOPY WITH PROPOFOL N/A 12/07/2019   Procedure: COLONOSCOPY WITH PROPOFOL;  Surgeon: Harvel Quale, MD;  [facility];  Service: Gastroenterology;  Laterality: N/A;  930   ELBOW SURGERY     NOSE SURGERY     PARTIAL KNEE  ARTHROPLASTY Right 12/28/2016   Procedure: UNICOMPARTMENTAL RIGHT KNEE;  Surgeon: Marchia Bond, MD;  Location: [place];  Service: Orthopedics;  Laterality: Right;   POLYPECTOMY  12/07/2019   Procedure: POLYPECTOMY;  Surgeon: Harvel Quale, MD;  [facility];  Service: Gastroenterology;;    Family History:No family history on file.  Social History: Social History   Tobacco Use  Smoking Status Every Day   Packs/day: 0.25   Types: Cigarettes  Smokeless Tobacco Never   Social History   Substance and Sexual Activity  Alcohol Use Not Currently   Social History   Substance and Sexual Activity  Drug Use No    Allergies: Allergies  Allergen Reactions   Penicillins Hives    Ok with Ancef    Medications: Current Outpatient Medications  Medication Sig Dispense Refill   albuterol (PROVENTIL) (2.5 MG/3ML) 0.083% nebulizer solution Take 2.5 mg by nebulization every 6 (six) hours as needed for wheezing or shortness of breath.     budesonide-formoterol (SYMBICORT) 160-4.5 MCG/ACT inhaler Inhale 2 puffs into the lungs 2 (two) times daily as needed (respiratory issues.).      carvedilol (COREG) 6.25 MG tablet Take 6.25 mg by mouth 2 (two) times daily.     diazepam (VALIUM) 5 MG tablet Take 1 tablet (5 mg total) by mouth 3 (three) times daily. (Patient taking differently: Take 5 mg by mouth every 12 (twelve) hours. anxiety) 30 tablet 0   DULoxetine (CYMBALTA) 60 MG capsule Take 60 mg by mouth daily.     finasteride (PROSCAR) 5 MG tablet Take 5 mg by mouth daily.     fluticasone (FLONASE) 50 MCG/ACT nasal spray Place 1 spray into both nostrils daily.     furosemide (LASIX) 40 MG tablet Take 40 mg by mouth daily.      linaclotide (LINZESS) 72 MCG capsule Take 1 capsule (72 mcg total) by mouth daily before breakfast. 90 capsule 3   lisinopril (ZESTRIL) 10 MG tablet Take 10 mg by mouth 2 (two) times daily.     omeprazole (PRILOSEC) 40 MG capsule Take 1 capsule (40 mg  total) by mouth daily. 90 capsule 3   ondansetron (ZOFRAN) 4 MG tablet Take 1 tablet (4 mg total) by mouth every 8 (eight) hours as needed for nausea or vomiting. (Patient taking differently: Take 4 mg by mouth 2 (two) times daily as needed for nausea or vomiting. ) 30 tablet 0   OVER THE COUNTER MEDICATION Take 1 capsule by mouth daily. Nugenix Supplement     oxyCODONE-acetaminophen (PERCOCET) 10-325 MG tablet Take 1 tablet by mouth every 4 (four) hours as needed for pain. (Patient taking differently: Take 1 tablet by mouth 2 (two) times daily as needed for pain (pain.). ) 50 tablet 0   pravastatin (PRAVACHOL) 40 MG tablet Take 40 mg by mouth daily.      sildenafil (VIAGRA) 100 MG tablet Take 1 tablet (100 mg total) by mouth daily as needed for erectile dysfunction. 15 tablet 11   tamsulosin (FLOMAX) 0.4 MG CAPS capsule Take 1 capsule (0.4 mg total) by mouth daily. 30 capsule  5   tetrahydrozoline 0.05 % ophthalmic solution Place 1-2 drops into both eyes 3 (three) times daily as needed (dry/irritated eyes.).      traZODone (DESYREL) 100 MG tablet Take 100 mg by mouth at bedtime.      No current facility-administered medications for this visit.    Review of Systems: GENERAL: negative for malaise, night sweats HEENT: No changes in hearing or vision, no nose bleeds or other nasal problems. NECK: Negative for lumps, goiter, pain and significant neck swelling RESPIRATORY: Negative for cough, wheezing CARDIOVASCULAR: Negative for chest pain, leg swelling, palpitations, orthopnea GI: SEE HPI MUSCULOSKELETAL: Negative for joint pain or swelling, back pain, and muscle pain. SKIN: Negative for lesions, rash PSYCH: Negative for sleep disturbance, mood disorder and recent psychosocial stressors. HEMATOLOGY Negative for prolonged bleeding, bruising easily, and swollen nodes. ENDOCRINE: Negative for cold or heat intolerance, polyuria, polydipsia and goiter. NEURO: negative for tremor, gait imbalance,  syncope and seizures. The remainder of the review of systems is noncontributory.   Physical Exam: BP 106/61 (BP Location: Left Arm, Patient Position: Sitting, Cuff Size: Large)   Pulse 70   Temp 98.1 F (36.7 C) (Oral)   Ht 5\' 9"  (1.753 m)   Wt 220 lb 1.6 oz (99.8 kg)   BMI 32.50 kg/m  GENERAL: The patient is AO x3, in no acute distress. HEENT: Head is normocephalic and atraumatic. EOMI are intact. Mouth is well hydrated and without lesions. NECK: Supple. No masses LUNGS: Clear to auscultation. No presence of rhonchi/wheezing/rales. Decreased chest expansion. HEART: RRR, normal s1 and s2. ABDOMEN: mildly tender diffusely but worse in upper abdomen, no guarding, no peritoneal signs. Mildly distended but not tympanic. BS +. No masses. EXTREMITIES: Without any cyanosis, clubbing, rash, lesions or edema. NEUROLOGIC: AOx3, no focal motor deficit. SKIN: no jaundice, no rashes  Imaging/Labs: as above  I personally reviewed and interpreted the available labs, imaging and endoscopic files.  Impression and Plan: Joseph Ellison is a 65 y.o. male with PMH HTN, COPD, CHF, GERD, chronic opiate use, depression, who presents for follow up of bloating.  Patient has presented recurrent episode of bloating despite having regular bowel movements while taking laxatives for his opiate induced constipation.  It is unclear to me why he has presented persistent bloating, although there is a possibility this could be related to irritable bowel syndrome in the setting of chronic constipation.  We will evaluate for other causes of bloating with hydrogen breath test, we will also check other intraluminal etiologies with an EGD with small bowel biopsies.  He can take Gas-X as needed more often to relieve his symptomatology but if this does not lead to improvement of his bloating can try taking some IBgard as needed.  In terms of his bowel movements he has presented improvement of his bowel movement frequency and  she will continue taking the Linzess 72 mcg every day but he can take every other day if having large amount of bowel movements.  - Schedule EGD - Referral for hydrogen breath test - Can take Gas-X every day to improve bloating episodes - Can try IBGard as needed if bloating not improving with Gas-X - Continue Linzess 72 mcg every day  All questions were answered.      Harvel Quale, MD Gastroenterology and Hepatology [facility]

## 2021-01-05 NOTE — Progress Notes (Signed)
Maylon Peppers, M.D. Gastroenterology & Hepatology [facility] [address] [place], [place] [address]  Primary Care Physician: Leeanne Rio, MD [place] [id number]  I will communicate my assessment and recommendations to the referring MD via EMR.  Problems: Chronic bloating Opiate induced constipation  History of Present Illness: Joseph Ellison is a 65 y.o. male with PMH HTN, COPD, CHF, GERD, chronic opiate use, depression, who presents for follow up of bloating.  The patient was last seen on 01/03/2020. At that time, the patient endorsed having loose bowel movements and had his Linzess decreased to 72 mcg every day, he had his omeprazole decreased to 40 mg every day for management of GERD.  Patient reports that he has felt intermittent bloating in his abdomen for the last year but this is worsened for the last 3 months. He reports the abdominal bloating causes significant discomfort in his abdomen, he is presenting this on a daily basis but he is concerned as he feels short of breath due to the bloating - this has affected his sleeping pattern. He is also surprised he feels so bloated as he does not eat too often during the day. States taking Gas X helps improving his symptoms but he is not taking this on a daily basis.  He has presented bowel movements every day, but sometimes he skips a dose as he has significant amount of bowel movements with Linzess 72 mcg every day.  Notably, for the last month he has felt recurrent episodes of nausea without vomiting, for which he states he has tried taking an OTC motion sickness medication with some relief.  He has tried a supplement called "balance of nature" for his nausea but felt it made his nausea symptoms worse.  Has not started any new medications otherwise.  He is following a GOLO diet to try to lose some weight but he believes that the bloating episodes have caused  his weight to increase.  States he quit smoking recently, which has helped with his chronic shortness of breath due to COPD.  The patient denies having any vomiting, fever, chills, hematochezia, melena, hematemesis, abdominal pain, diarrhea, jaundice, pruritus. Lost some wieght after he was heavily diuresed but states now he has significant amount of bloating.  As part Of the investigations he has had in the past, he underwent as part of CT abdomen and pelvis with IV contrast 08/23/2019 at [facility].  This was performed for evaluation of abdominal pain and possible melena.  Findings in the report are described below 1. No bowel wall thickening or bowel obstruction. No abscess in the  abdomen or pelvis. Appendix appears normal. Note that there is moderate stool in the colon.  2. There is lipomatous infiltration in the ileocecal valve 3. Hepatic steatosis, a finding that also may be variably associated with abdominal pain.  4. No renal or ureteral calculus on either side. No hydronephrosis on either side. Urinary bladder is midline with wall thickness normal.    Last EGD: never Last Colonoscopy: 12/2019-Small angiodysplasia cecum, 2 mm polyp ascending, diverticulosis. Path with benign colonic mucosa  Past Medical History: Past Medical History:  Diagnosis Date   Arthritis    Back pain, chronic    COPD (chronic obstructive pulmonary disease) (HCC)    Depression    GERD (gastroesophageal reflux disease)    Hypertension    MRSA (methicillin resistant Staphylococcus aureus)    Neurogenic pain, leg  Primary localized osteoarthritis of right knee 12/28/2016    Past Surgical History: Past Surgical History:  Procedure Laterality Date   COLONOSCOPY WITH PROPOFOL N/A 12/07/2019   Procedure: COLONOSCOPY WITH PROPOFOL;  Surgeon: Harvel Quale, MD;  [facility];  Service: Gastroenterology;  Laterality: N/A;  930   ELBOW SURGERY     NOSE SURGERY     PARTIAL KNEE  ARTHROPLASTY Right 12/28/2016   Procedure: UNICOMPARTMENTAL RIGHT KNEE;  Surgeon: Marchia Bond, MD;  Location: [place];  Service: Orthopedics;  Laterality: Right;   POLYPECTOMY  12/07/2019   Procedure: POLYPECTOMY;  Surgeon: Harvel Quale, MD;  [facility];  Service: Gastroenterology;;    Family History:No family history on file.  Social History: Social History   Tobacco Use  Smoking Status Every Day   Packs/day: 0.25   Types: Cigarettes  Smokeless Tobacco Never   Social History   Substance and Sexual Activity  Alcohol Use Not Currently   Social History   Substance and Sexual Activity  Drug Use No    Allergies: Allergies  Allergen Reactions   Penicillins Hives    Ok with Ancef    Medications: Current Outpatient Medications  Medication Sig Dispense Refill   albuterol (PROVENTIL) (2.5 MG/3ML) 0.083% nebulizer solution Take 2.5 mg by nebulization every 6 (six) hours as needed for wheezing or shortness of breath.     budesonide-formoterol (SYMBICORT) 160-4.5 MCG/ACT inhaler Inhale 2 puffs into the lungs 2 (two) times daily as needed (respiratory issues.).      carvedilol (COREG) 6.25 MG tablet Take 6.25 mg by mouth 2 (two) times daily.     diazepam (VALIUM) 5 MG tablet Take 1 tablet (5 mg total) by mouth 3 (three) times daily. (Patient taking differently: Take 5 mg by mouth every 12 (twelve) hours. anxiety) 30 tablet 0   DULoxetine (CYMBALTA) 60 MG capsule Take 60 mg by mouth daily.     finasteride (PROSCAR) 5 MG tablet Take 5 mg by mouth daily.     fluticasone (FLONASE) 50 MCG/ACT nasal spray Place 1 spray into both nostrils daily.     furosemide (LASIX) 40 MG tablet Take 40 mg by mouth daily.      linaclotide (LINZESS) 72 MCG capsule Take 1 capsule (72 mcg total) by mouth daily before breakfast. 90 capsule 3   lisinopril (ZESTRIL) 10 MG tablet Take 10 mg by mouth 2 (two) times daily.     omeprazole (PRILOSEC) 40 MG capsule Take 1 capsule (40 mg  total) by mouth daily. 90 capsule 3   ondansetron (ZOFRAN) 4 MG tablet Take 1 tablet (4 mg total) by mouth every 8 (eight) hours as needed for nausea or vomiting. (Patient taking differently: Take 4 mg by mouth 2 (two) times daily as needed for nausea or vomiting. ) 30 tablet 0   OVER THE COUNTER MEDICATION Take 1 capsule by mouth daily. Nugenix Supplement     oxyCODONE-acetaminophen (PERCOCET) 10-325 MG tablet Take 1 tablet by mouth every 4 (four) hours as needed for pain. (Patient taking differently: Take 1 tablet by mouth 2 (two) times daily as needed for pain (pain.). ) 50 tablet 0   pravastatin (PRAVACHOL) 40 MG tablet Take 40 mg by mouth daily.      sildenafil (VIAGRA) 100 MG tablet Take 1 tablet (100 mg total) by mouth daily as needed for erectile dysfunction. 15 tablet 11   tamsulosin (FLOMAX) 0.4 MG CAPS capsule Take 1 capsule (0.4 mg total) by mouth daily. 30 capsule  5   tetrahydrozoline 0.05 % ophthalmic solution Place 1-2 drops into both eyes 3 (three) times daily as needed (dry/irritated eyes.).      traZODone (DESYREL) 100 MG tablet Take 100 mg by mouth at bedtime.      No current facility-administered medications for this visit.    Review of Systems: GENERAL: negative for malaise, night sweats HEENT: No changes in hearing or vision, no nose bleeds or other nasal problems. NECK: Negative for lumps, goiter, pain and significant neck swelling RESPIRATORY: Negative for cough, wheezing CARDIOVASCULAR: Negative for chest pain, leg swelling, palpitations, orthopnea GI: SEE HPI MUSCULOSKELETAL: Negative for joint pain or swelling, back pain, and muscle pain. SKIN: Negative for lesions, rash PSYCH: Negative for sleep disturbance, mood disorder and recent psychosocial stressors. HEMATOLOGY Negative for prolonged bleeding, bruising easily, and swollen nodes. ENDOCRINE: Negative for cold or heat intolerance, polyuria, polydipsia and goiter. NEURO: negative for tremor, gait imbalance,  syncope and seizures. The remainder of the review of systems is noncontributory.   Physical Exam: BP 106/61 (BP Location: Left Arm, Patient Position: Sitting, Cuff Size: Large)   Pulse 70   Temp 98.1 F (36.7 C) (Oral)   Ht 5\' 9"  (1.753 m)   Wt 220 lb 1.6 oz (99.8 kg)   BMI 32.50 kg/m  GENERAL: The patient is AO x3, in no acute distress. HEENT: Head is normocephalic and atraumatic. EOMI are intact. Mouth is well hydrated and without lesions. NECK: Supple. No masses LUNGS: Clear to auscultation. No presence of rhonchi/wheezing/rales. Decreased chest expansion. HEART: RRR, normal s1 and s2. ABDOMEN: mildly tender diffusely but worse in upper abdomen, no guarding, no peritoneal signs. Mildly distended but not tympanic. BS +. No masses. EXTREMITIES: Without any cyanosis, clubbing, rash, lesions or edema. NEUROLOGIC: AOx3, no focal motor deficit. SKIN: no jaundice, no rashes  Imaging/Labs: as above  I personally reviewed and interpreted the available labs, imaging and endoscopic files.  Impression and Plan: Joseph Ellison is a 65 y.o. male with PMH HTN, COPD, CHF, GERD, chronic opiate use, depression, who presents for follow up of bloating.  Patient has presented recurrent episode of bloating despite having regular bowel movements while taking laxatives for his opiate induced constipation.  It is unclear to me why he has presented persistent bloating, although there is a possibility this could be related to irritable bowel syndrome in the setting of chronic constipation.  We will evaluate for other causes of bloating with hydrogen breath test, we will also check other intraluminal etiologies with an EGD with small bowel biopsies.  He can take Gas-X as needed more often to relieve his symptomatology but if this does not lead to improvement of his bloating can try taking some IBgard as needed.  In terms of his bowel movements he has presented improvement of his bowel movement frequency and  she will continue taking the Linzess 72 mcg every day but he can take every other day if having large amount of bowel movements.  - Schedule EGD - Referral for hydrogen breath test - Can take Gas-X every day to improve bloating episodes - Can try IBGard as needed if bloating not improving with Gas-X - Continue Linzess 72 mcg every day  All questions were answered.      Harvel Quale, MD Gastroenterology and Hepatology [facility]

## 2021-01-05 NOTE — Progress Notes (Signed)
History of Present Illness: Here for followup of BPH w/ sxs as well as ED.   On dual medical Rx w/ finasteride and tamsulosin. No UTIs since his last visit in mid-2020.  He is on sildenafil.  He would like a refill.  IPSS 14, quality-of-life score 3.  Past Medical History:  Diagnosis Date   Arthritis    Back pain, chronic    COPD (chronic obstructive pulmonary disease) (HCC)    Depression    GERD (gastroesophageal reflux disease)    Hypertension    MRSA (methicillin resistant Staphylococcus aureus)    Neurogenic pain, leg    Primary localized osteoarthritis of right knee 12/28/2016    Past Surgical History:  Procedure Laterality Date   COLONOSCOPY WITH PROPOFOL N/A 12/07/2019   Procedure: COLONOSCOPY WITH PROPOFOL;  Surgeon: Harvel Quale, MD;  [facility];  Service: Gastroenterology;  Laterality: N/A;  930   ELBOW SURGERY     NOSE SURGERY     PARTIAL KNEE ARTHROPLASTY Right 12/28/2016   Procedure: UNICOMPARTMENTAL RIGHT KNEE;  Surgeon: Marchia Bond, MD;  Location: [place];  Service: Orthopedics;  Laterality: Right;   POLYPECTOMY  12/07/2019   Procedure: POLYPECTOMY;  Surgeon: Harvel Quale, MD;  [facility];  Service: Gastroenterology;;    Home Medications:  Allergies as of 01/06/2021       Reactions   Penicillins Hives   Ok with Ancef        Medication List        Accurate as of January 05, 2021  7:25 PM. If you have any questions, ask your nurse or doctor.          albuterol (2.5 MG/3ML) 0.083% nebulizer solution Commonly known as: PROVENTIL Take 2.5 mg by nebulization every 6 (six) hours as needed for wheezing or shortness of breath.   budesonide-formoterol 160-4.5 MCG/ACT inhaler Commonly known as: SYMBICORT Inhale 2 puffs into the lungs 2 (two) times daily as needed (respiratory issues.).   carvedilol 6.25 MG tablet Commonly known as: COREG Take 6.25 mg by mouth 2 (two) times daily.   diazepam 5 MG  tablet Commonly known as: VALIUM Take 1 tablet (5 mg total) by mouth 3 (three) times daily. What changed:  when to take this additional instructions   DULoxetine 60 MG capsule Commonly known as: CYMBALTA Take 60 mg by mouth daily.   finasteride 5 MG tablet Commonly known as: PROSCAR Take 5 mg by mouth daily.   fluticasone 50 MCG/ACT nasal spray Commonly known as: FLONASE Place 1 spray into both nostrils daily.   furosemide 40 MG tablet Commonly known as: LASIX Take 40 mg by mouth daily.   linaclotide 72 MCG capsule Commonly known as: Linzess Take 1 capsule (72 mcg total) by mouth daily before breakfast.   lisinopril 10 MG tablet Commonly known as: ZESTRIL Take 10 mg by mouth 2 (two) times daily.   omeprazole 40 MG capsule Commonly known as: PRILOSEC Take 1 capsule (40 mg total) by mouth daily.   ondansetron 4 MG tablet Commonly known as: Zofran Take 1 tablet (4 mg total) by mouth every 8 (eight) hours as needed for nausea or vomiting.   OVER THE COUNTER MEDICATION Take 1 capsule by mouth daily. Nugenix Supplement   oxyCODONE-acetaminophen 10-325 MG tablet Commonly known as: PERCOCET Take 1 tablet by mouth every 4 (four) hours as needed for pain. What changed:  when to take this reasons to take this   pravastatin 40 MG tablet Commonly known  as: PRAVACHOL Take 40 mg by mouth daily.   sildenafil 100 MG tablet Commonly known as: VIAGRA Take 1 tablet (100 mg total) by mouth daily as needed for erectile dysfunction.   tamsulosin 0.4 MG Caps capsule Commonly known as: FLOMAX Take 1 capsule (0.4 mg total) by mouth daily.   tetrahydrozoline 0.05 % ophthalmic solution Place 1-2 drops into both eyes 3 (three) times daily as needed (dry/irritated eyes.).   traZODone 100 MG tablet Commonly known as: DESYREL Take 100 mg by mouth at bedtime.        Allergies:  Allergies  Allergen Reactions   Penicillins Hives    Ok with Ancef    No family history on  file.  Social History:  reports that he quit smoking about 5 months ago. His smoking use included cigarettes. He smoked an average of .25 packs per day. He has never used smokeless tobacco. He reports that he does not currently use alcohol. He reports that he does not use drugs.  ROS: A complete review of systems was performed.  All systems are negative except for pertinent findings as noted.  Physical Exam:  Vital signs in last 24 hours: There were no vitals taken for this visit. Constitutional:  Alert and oriented, No acute distress Cardiovascular: Regular rate  Respiratory: Normal respiratory effort GI: Abdomen is soft, nontender, nondistended, no abdominal masses. No CVAT.  Genitourinary: Normal male phallus, testes are descended bilaterally and non-tender and without masses, scrotum is normal in appearance without lesions or masses, perineum is normal on inspection.  Normal anal sphincter tone.  Prostate 30 g, symmetric, nonnodular, nontender. Lymphatic: No lymphadenopathy Neurologic: Grossly intact, no focal deficits Psychiatric: Normal mood and affect  I have reviewed prior pt notes  I have reviewed notes from referring/previous physicians  I have reviewed urinalysis results      Impression/Assessment:  1.  BPH with moderate symptoms.  He has a very small gland palpably, I do not think he needs to be on finasteride  2.  ED, on sildenafil  Plan:  1.  Tamsulosin/sildenafil refilled  2.  I think it is okay to stop the finasteride  3.  Patient interested in having his testosterone level checked-she will be seeing Dr. Huel Cote in the near future.  I will have him ask her about that, as well as having PSA checked  4.  I will see back in a year

## 2021-01-05 NOTE — Patient Instructions (Addendum)
Schedule EGD Referral for hydrogen breath test Can take Gas-X every day to improve bloating episodes Can try IBGard as needed if bloating not improving with Gas-X Continue Linzess 72 mcg every day

## 2021-01-06 ENCOUNTER — Encounter: Payer: Self-pay | Admitting: Urology

## 2021-01-06 ENCOUNTER — Encounter (INDEPENDENT_AMBULATORY_CARE_PROVIDER_SITE_OTHER): Payer: Self-pay

## 2021-01-06 ENCOUNTER — Ambulatory Visit (INDEPENDENT_AMBULATORY_CARE_PROVIDER_SITE_OTHER): Payer: Medicare Other | Admitting: Urology

## 2021-01-06 VITALS — BP 142/65 | HR 65 | Temp 98.0°F | Wt 226.2 lb

## 2021-01-06 DIAGNOSIS — R350 Frequency of micturition: Secondary | ICD-10-CM

## 2021-01-06 DIAGNOSIS — N521 Erectile dysfunction due to diseases classified elsewhere: Secondary | ICD-10-CM

## 2021-01-06 LAB — URINALYSIS, ROUTINE W REFLEX MICROSCOPIC
Bilirubin, UA: NEGATIVE
Glucose, UA: NEGATIVE
Ketones, UA: NEGATIVE
Leukocytes,UA: NEGATIVE
Nitrite, UA: NEGATIVE
Protein,UA: NEGATIVE
RBC, UA: NEGATIVE
Specific Gravity, UA: 1.025 (ref 1.005–1.030)
Urobilinogen, Ur: 0.2 mg/dL (ref 0.2–1.0)
pH, UA: 5.5 (ref 5.0–7.5)

## 2021-01-06 MED ORDER — TAMSULOSIN HCL 0.4 MG PO CAPS: 0.4000 mg | ORAL_CAPSULE | Freq: Every day | ORAL | 3 refills | Status: AC

## 2021-01-06 MED ORDER — SILDENAFIL CITRATE 100 MG PO TABS: 100.0000 mg | ORAL_TABLET | Freq: Every day | ORAL | 11 refills | Status: DC | PRN

## 2021-01-06 NOTE — Progress Notes (Signed)
Urological Symptom Review  Patient is experiencing the following symptoms: Frequent urination Get up at night to urinate Stream starts and stops Trouble starting stream   Review of Systems  Gastrointestinal (upper)  : Negative for upper GI symptoms  Gastrointestinal (lower) : Negative for lower GI symptoms  Constitutional : Negative for symptoms  Skin: Negative for skin symptoms  Eyes: Negative for eye symptoms  Ear/Nose/Throat : Negative for Ear/Nose/Throat symptoms  Hematologic/Lymphatic: Negative for Hematologic/Lymphatic symptoms  Cardiovascular : Negative for cardiovascular symptoms  Respiratory : Negative for respiratory symptoms  Endocrine: Negative for endocrine symptoms  Musculoskeletal: Negative for musculoskeletal symptoms  Neurological: Negative for neurological symptoms  Psychologic: Negative for psychiatric symptoms

## 2021-01-12 ENCOUNTER — Telehealth: Payer: Self-pay

## 2021-01-12 ENCOUNTER — Other Ambulatory Visit: Payer: Self-pay

## 2021-01-12 DIAGNOSIS — N521 Erectile dysfunction due to diseases classified elsewhere: Secondary | ICD-10-CM

## 2021-01-12 MED ORDER — SILDENAFIL CITRATE 100 MG PO TABS: 100.0000 mg | ORAL_TABLET | Freq: Every day | ORAL | 11 refills | Status: AC | PRN

## 2021-01-12 NOTE — Telephone Encounter (Signed)
Patient called advising mediation (sildenafil (VIAGRA) 100 MG tablet) was not sent to pharmacy he wanted to check on RX.

## 2021-01-12 NOTE — Telephone Encounter (Signed)
Prescription sent to [place].

## 2021-01-13 NOTE — Patient Instructions (Signed)
GILLIS BOARDLEY  01/13/2021     @PREFPERIOPPHARMACY @   Your procedure is scheduled on 01/20/2021.   Report to [facility] at  [facility]   Call this number if you have problems the morning of surgery:  [phone]   Remember:  Follow the diet instructions given to you by the office.    Use your nebulizer and your inhalers before you come and bring your rescue inhaler with you.    Take these medicines the morning of surgery with A SIP OF WATER         carvedilol, valium (if needed), prilosec, oxycodone (if needed), flomax.     Do not wear jewelry, make-up or nail polish.  Do not wear lotions, powders, or perfumes, or deodorant.  Do not shave 48 hours prior to surgery.  Men may shave face and neck.  Do not bring valuables to the hospital.  [facility] is not responsible for any belongings or valuables.  Contacts, dentures or bridgework may not be worn into surgery.  Leave your suitcase in the car.  After surgery it may be brought to your room.  For patients admitted to the hospital, discharge time will be determined by your treatment team.  Patients discharged the day of surgery will not be allowed to drive home and must have someone with them for 24 hours.    Special instructions:   DO NOT smoke tobacco or vape for 24 hours before your procedure.  Please read over the following fact sheets that you were given. Anesthesia Post-op Instructions and Care and Recovery After Surgery      Upper Endoscopy, Adult, Care After This sheet gives you information about how to care for yourself after your procedure. Your health care provider may also give you more specific instructions. If you have problems or questions, contact your health care provider. What can I expect after the procedure? After the procedure, it is common to have: A sore throat. Mild stomach pain or discomfort. Bloating. Nausea. Follow these instructions at home:  Follow instructions from your  health care provider about what to eat or drink after your procedure. Return to your normal activities as told by your health care provider. Ask your health care provider what activities are safe for you. Take over-the-counter and prescription medicines only as told by your health care provider. If you were given a sedative during the procedure, it can affect you for several hours. Do not drive or operate machinery until your health care provider says that it is safe. Keep all follow-up visits as told by your health care provider. This is important. Contact a health care provider if you have: A sore throat that lasts longer than one day. Trouble swallowing. Get help right away if: You vomit blood or your vomit looks like coffee grounds. You have: A fever. Bloody, black, or tarry stools. A severe sore throat or you cannot swallow. Difficulty breathing. Severe pain in your chest or abdomen. Summary After the procedure, it is common to have a sore throat, mild stomach discomfort, bloating, and nausea. If you were given a sedative during the procedure, it can affect you for several hours. Do not drive or operate machinery until your health care provider says that it is safe. Follow instructions from your health care provider about what to eat or drink after your procedure. Return to your normal activities as told by your health care provider. This information is not intended to replace  advice given to you by your health care provider. Make sure you discuss any questions you have with your health care provider. Document Revised: 12/29/2018 Document Reviewed: 07/25/2017 Elsevier Patient Education  2022 [employer] After This sheet gives you information about how to care for yourself after your procedure. Your health care provider may also give you more specific instructions. If you have problems or questions, contact your health care provider. What can I expect  after the procedure? After the procedure, it is common to have: Tiredness. Forgetfulness about what happened after the procedure. Impaired judgment for important decisions. Nausea or vomiting. Some difficulty with balance. Follow these instructions at home: For the time period you were told by your health care provider:   Rest as needed. Do not participate in activities where you could fall or become injured. Do not drive or use machinery. Do not drink alcohol. Do not take sleeping pills or medicines that cause drowsiness. Do not make important decisions or sign legal documents. Do not take care of children on your own. Eating and drinking Follow the diet that is recommended by your health care provider. Drink enough fluid to keep your urine pale yellow. If you vomit: Drink water, juice, or soup when you can drink without vomiting. Make sure you have little or no nausea before eating solid foods. General instructions Have a responsible adult stay with you for the time you are told. It is important to have someone help care for you until you are awake and alert. Take over-the-counter and prescription medicines only as told by your health care provider. If you have sleep apnea, surgery and certain medicines can increase your risk for breathing problems. Follow instructions from your health care provider about wearing your sleep device: Anytime you are sleeping, including during daytime naps. While taking prescription pain medicines, sleeping medicines, or medicines that make you drowsy. Avoid smoking. Keep all follow-up visits as told by your health care provider. This is important. Contact a health care provider if: You keep feeling nauseous or you keep vomiting. You feel light-headed. You are still sleepy or having trouble with balance after 24 hours. You develop a rash. You have a fever. You have redness or swelling around the IV site. Get help right away if: You have trouble  breathing. You have new-onset confusion at home. Summary For several hours after your procedure, you may feel tired. You may also be forgetful and have poor judgment. Have a responsible adult stay with you for the time you are told. It is important to have someone help care for you until you are awake and alert. Rest as told. Do not drive or operate machinery. Do not drink alcohol or take sleeping pills. Get help right away if you have trouble breathing, or if you suddenly become confused. This information is not intended to replace advice given to you by your health care provider. Make sure you discuss any questions you have with your health care provider. Document Revised: 11/08/2019 Document Reviewed: 01/25/2019 Elsevier Patient Education  2022 [employer].

## 2021-01-14 ENCOUNTER — Encounter (INDEPENDENT_AMBULATORY_CARE_PROVIDER_SITE_OTHER): Payer: Self-pay

## 2021-01-15 ENCOUNTER — Encounter (HOSPITAL_COMMUNITY)
Admission: RE | Admit: 2021-01-15 | Discharge: 2021-01-15 | Disposition: A | Discharge status: Home / Self Care | Payer: Medicare Other | Source: Ambulatory Visit | Attending: Gastroenterology | Admitting: Gastroenterology

## 2021-01-15 ENCOUNTER — Other Ambulatory Visit: Payer: Self-pay

## 2021-01-15 VITALS — BP 91/58 | HR 62 | Temp 98.4°F | Resp 18 | Ht 69.0 in | Wt 217.0 lb

## 2021-01-15 DIAGNOSIS — Z01818 Encounter for other preprocedural examination: Secondary | ICD-10-CM | POA: Diagnosis not present

## 2021-01-15 DIAGNOSIS — Z79899 Other long term (current) drug therapy: Secondary | ICD-10-CM | POA: Insufficient documentation

## 2021-01-15 LAB — BASIC METABOLIC PANEL
Anion gap: 9 (ref 5–15)
BUN: 20 mg/dL (ref 8–23)
CO2: 26 mmol/L (ref 22–32)
Calcium: 8.6 mg/dL — ABNORMAL LOW (ref 8.9–10.3)
Chloride: 101 mmol/L (ref 98–111)
Creatinine, Ser: 0.7 mg/dL (ref 0.61–1.24)
GFR, Estimated: >60 mL/min (ref >60)
Glucose, Bld: 121 mg/dL — ABNORMAL HIGH (ref 70–99)
Potassium: 3.9 mmol/L (ref 3.5–5.1)
Sodium: 136 mmol/L (ref 135–145)

## 2021-01-20 ENCOUNTER — Other Ambulatory Visit: Payer: Self-pay

## 2021-01-20 ENCOUNTER — Ambulatory Visit (HOSPITAL_COMMUNITY): Payer: Medicare Other | Admitting: Anesthesiology

## 2021-01-20 ENCOUNTER — Encounter (HOSPITAL_COMMUNITY): Payer: Self-pay | Admitting: Gastroenterology

## 2021-01-20 ENCOUNTER — Encounter (HOSPITAL_COMMUNITY)
Admission: RE | Disposition: A | Discharge status: Home / Self Care | Payer: Self-pay | Source: Home / Self Care | Attending: Gastroenterology

## 2021-01-20 ENCOUNTER — Ambulatory Visit (HOSPITAL_COMMUNITY)
Admission: RE | Admit: 2021-01-20 | Discharge: 2021-01-20 | Disposition: A | Discharge status: Home / Self Care | Payer: Medicare Other | Attending: Gastroenterology | Admitting: Gastroenterology

## 2021-01-20 DIAGNOSIS — F32A Depression, unspecified: Secondary | ICD-10-CM | POA: Diagnosis not present

## 2021-01-20 DIAGNOSIS — R140 Abdominal distension (gaseous): Secondary | ICD-10-CM

## 2021-01-20 DIAGNOSIS — K219 Gastro-esophageal reflux disease without esophagitis: Secondary | ICD-10-CM | POA: Diagnosis not present

## 2021-01-20 DIAGNOSIS — I509 Heart failure, unspecified: Secondary | ICD-10-CM | POA: Diagnosis not present

## 2021-01-20 DIAGNOSIS — Z87891 Personal history of nicotine dependence: Secondary | ICD-10-CM | POA: Diagnosis not present

## 2021-01-20 DIAGNOSIS — I10 Essential (primary) hypertension: Secondary | ICD-10-CM | POA: Insufficient documentation

## 2021-01-20 DIAGNOSIS — K5903 Drug induced constipation: Secondary | ICD-10-CM | POA: Diagnosis not present

## 2021-01-20 DIAGNOSIS — T40605A Adverse effect of unspecified narcotics, initial encounter: Secondary | ICD-10-CM | POA: Insufficient documentation

## 2021-01-20 DIAGNOSIS — K2950 Unspecified chronic gastritis without bleeding: Secondary | ICD-10-CM | POA: Insufficient documentation

## 2021-01-20 DIAGNOSIS — I110 Hypertensive heart disease with heart failure: Secondary | ICD-10-CM | POA: Diagnosis not present

## 2021-01-20 DIAGNOSIS — K31A0 Gastric intestinal metaplasia, unspecified: Secondary | ICD-10-CM | POA: Insufficient documentation

## 2021-01-20 DIAGNOSIS — J449 Chronic obstructive pulmonary disease, unspecified: Secondary | ICD-10-CM | POA: Insufficient documentation

## 2021-01-20 DIAGNOSIS — K2980 Duodenitis without bleeding: Secondary | ICD-10-CM | POA: Insufficient documentation

## 2021-01-20 DIAGNOSIS — K3189 Other diseases of stomach and duodenum: Secondary | ICD-10-CM | POA: Diagnosis not present

## 2021-01-20 HISTORY — PX: ESOPHAGOGASTRODUODENOSCOPY (EGD) WITH PROPOFOL: SHX5813

## 2021-01-20 HISTORY — PX: BIOPSY: SHX5522

## 2021-01-20 SURGERY — ESOPHAGOGASTRODUODENOSCOPY (EGD) WITH PROPOFOL
Anesthesia: General

## 2021-01-20 MED ORDER — LACTATED RINGERS IV SOLN: INTRAVENOUS | Status: DC

## 2021-01-20 MED ORDER — PROPOFOL 10 MG/ML IV BOLUS
INTRAVENOUS | Status: DC | PRN
  Administered 2021-01-20: 100 mg via INTRAVENOUS

## 2021-01-20 MED ORDER — LACTATED RINGERS IV SOLN: INTRAVENOUS | Status: DC | PRN

## 2021-01-20 MED ORDER — LIDOCAINE HCL (CARDIAC) PF 100 MG/5ML IV SOSY
PREFILLED_SYRINGE | INTRAVENOUS | Status: DC | PRN
  Administered 2021-01-20: 60 mg via INTRAVENOUS

## 2021-01-20 MED ORDER — PROPOFOL 500 MG/50ML IV EMUL
INTRAVENOUS | Status: AC
  Filled 2021-01-20: qty 50

## 2021-01-20 MED ORDER — EPHEDRINE SULFATE-NACL 50-0.9 MG/10ML-% IV SOSY
PREFILLED_SYRINGE | INTRAVENOUS | Status: DC | PRN
  Administered 2021-01-20: 10 mg via INTRAVENOUS
  Administered 2021-01-20 (×2): 5 mg via INTRAVENOUS

## 2021-01-20 MED ORDER — EPHEDRINE 5 MG/ML INJ
INTRAVENOUS | Status: AC
  Filled 2021-01-20: qty 5

## 2021-01-20 MED ORDER — PROPOFOL 500 MG/50ML IV EMUL
INTRAVENOUS | Status: DC | PRN
  Administered 2021-01-20: 150 ug/kg/min via INTRAVENOUS

## 2021-01-20 NOTE — Op Note (Signed)
[facility] Patient Name: Joseph Ellison Procedure Date: 01/20/2021 8:35 AM MRN: [id number] Date of Birth: 22-Nov-1955 Attending MD: Maylon Peppers ,  CSN: [id number] Age: 65 Admit Type: Outpatient Procedure:                Upper GI endoscopy Indications:              Abdominal bloating Providers:                Maylon Peppers, Charlsie Quest. Theda Sers RN, RN, Nelma Rothman, Technician Referring MD:              Medicines:                Monitored Anesthesia Care Complications:            No immediate complications. Estimated Blood Loss:     Estimated blood loss: none. Procedure:                Pre-Anesthesia Assessment:                           - Prior to the procedure, a History and Physical                            was performed, and patient medications, allergies                            and sensitivities were reviewed. The patient's                            tolerance of previous anesthesia was reviewed.                           - The risks and benefits of the procedure and the                            sedation options and risks were discussed with the                            patient. All questions were answered and informed                            consent was obtained.                           - ASA Grade Assessment: II - A patient with mild                            systemic disease.                           After obtaining informed consent, the endoscope was                            passed under direct vision. Throughout the  procedure, the patient's blood pressure, pulse, and                            oxygen saturations were monitored continuously. The                            GIF-H190 ([id number]) scope was introduced through the                            mouth, and advanced to the second part of duodenum.                            The upper GI endoscopy was accomplished without                             difficulty. The patient tolerated the procedure                            well. Scope In: 8:50:34 AM Scope Out: 8:57:50 AM Total Procedure Duration: 0 hours 7 minutes 16 seconds  Findings:      The examined esophagus was normal.      The Z-line was regular and was found 41 cm from the incisors.      The entire examined stomach was normal. Biopsies were taken with a cold       forceps for Helicobacter pylori testing.      Localized mild inflammation characterized by erythema and linear       erosions was found in the duodenal bulb and in the first portion of the       duodenum. Biopsies were taken with a cold forceps for histology. Impression:               - Normal esophagus.                           - Z-line regular, 41 cm from the incisors.                           - Normal stomach. Biopsied.                           - Duodenitis. Biopsied. Moderate Sedation:      Per Anesthesia Care Recommendation:           - Discharge patient to home (ambulatory).                           - Resume previous diet.                           - Await pathology results.                           - Continue pantoprazole 40 mg every day.                           - Can take Gas-X as needed for bloating episodes.                           -  Check H. pylori serology today Procedure Code(s):        --- Professional ---                           [phone], Esophagogastroduodenoscopy, flexible,                            transoral; with biopsy, single or multiple Diagnosis Code(s):        --- Professional ---                           K29.80, Duodenitis without bleeding                           R14.0, Abdominal distension (gaseous) CPT copyright 2019 American [employer] Association. All rights reserved. The codes documented in this report are preliminary and upon coder review may  be revised to meet current compliance requirements. Maylon Peppers, MD Maylon Peppers,  01/20/2021 9:01:30 AM This  report has been signed electronically. Number of Addenda: 0

## 2021-01-20 NOTE — Transfer of Care (Signed)
Immediate Anesthesia Transfer of Care Note  Patient: Joseph Ellison  Procedure(s) Performed: ESOPHAGOGASTRODUODENOSCOPY (EGD) WITH PROPOFOL BIOPSY  Patient Location: Short Stay  Anesthesia Type:General  Level of Consciousness: awake  Airway & Oxygen Therapy: Patient Spontanous Breathing  Post-op Assessment: Report given to RN and Post -op Vital signs reviewed and stable  Post vital signs: Reviewed and stable  Last Vitals:  Vitals Value Taken Time  BP    Temp    Pulse    Resp    SpO2      Last Pain:  Vitals:   01/20/21 0848  TempSrc:   PainSc: 9       Patients Stated Pain Goal: 5 (29/09/03 0149)  Complications: No notable events documented.

## 2021-01-20 NOTE — Discharge Instructions (Addendum)
You are being discharged to home.  Resume your previous diet.  We are waiting for your pathology results.  Continue omeprazole 40 mg every day. Can take Gas-X as needed for bloating episodes.

## 2021-01-20 NOTE — Interval H&P Note (Signed)
History and Physical Interval Note:  01/20/2021 8:09 AM  Joseph Ellison  has presented today for surgery, with the diagnosis of Bloating and Nausea.  The various methods of treatment have been discussed with the patient and family. After consideration of risks, benefits and other options for treatment, the patient has consented to  Procedure(s) with comments: ESOPHAGOGASTRODUODENOSCOPY (EGD) WITH PROPOFOL (N/A) - 8:50 as a surgical intervention.  The patient's history has been reviewed, patient examined, no change in status, stable for surgery.  I have reviewed the patient's chart and labs.  Questions were answered to the patient's satisfaction.     [name]

## 2021-01-20 NOTE — Anesthesia Procedure Notes (Signed)
Date/Time: 01/20/2021 8:55 AM Performed by: Orlie Dakin, CRNA Pre-anesthesia Checklist: Patient identified, Emergency Drugs available, Suction available and Patient being monitored Patient Re-evaluated:Patient Re-evaluated prior to induction Oxygen Delivery Method: Nasal cannula Induction Type: IV induction Placement Confirmation: positive ETCO2

## 2021-01-20 NOTE — Anesthesia Postprocedure Evaluation (Signed)
Anesthesia Post Note  Patient: Joseph Ellison  Procedure(s) Performed: ESOPHAGOGASTRODUODENOSCOPY (EGD) WITH PROPOFOL BIOPSY  Patient location during evaluation: Phase II Anesthesia Type: General Level of consciousness: awake Pain management: pain level controlled Vital Signs Assessment: post-procedure vital signs reviewed and stable Respiratory status: spontaneous breathing and respiratory function stable Cardiovascular status: blood pressure returned to baseline and stable Postop Assessment: no headache and no apparent nausea or vomiting Anesthetic complications: no Comments: Late entry   No notable events documented.   Last Vitals:  Vitals:   01/20/21 0755 01/20/21 0902  BP: (!) 143/71 (!) 100/55  Pulse: (!) 56 (!) 53  Resp: 14 14  Temp: 36.7 C 36.6 C  SpO2: 97% 98%    Last Pain:  Vitals:   01/20/21 0902  TempSrc: Oral  PainSc: 0-No pain                 Louann Sjogren

## 2021-01-20 NOTE — Anesthesia Preprocedure Evaluation (Signed)
Anesthesia Evaluation  Patient identified by MRN, date of birth, ID band Patient awake    Reviewed: Allergy & Precautions, H&P , NPO status , Patient's Chart, lab work & pertinent test results, reviewed documented beta blocker date and time   Airway Mallampati: II  TM Distance: >3 FB Neck ROM: full    Dental no notable dental hx.    Pulmonary COPD, former smoker,    Pulmonary exam normal breath sounds clear to auscultation       Cardiovascular Exercise Tolerance: Good hypertension, negative cardio ROS   Rhythm:regular Rate:Normal     Neuro/Psych PSYCHIATRIC DISORDERS Depression negative neurological ROS     GI/Hepatic Neg liver ROS, GERD  Medicated,  Endo/Other  negative endocrine ROS  Renal/GU negative Renal ROS  negative genitourinary   Musculoskeletal   Abdominal   Peds  Hematology negative hematology ROS (+)   Anesthesia Other Findings   Reproductive/Obstetrics negative OB ROS                             Anesthesia Physical Anesthesia Plan  ASA: 3  Anesthesia Plan: General   Post-op Pain Management:    Induction:   PONV Risk Score and Plan: Propofol infusion  Airway Management Planned:   Additional Equipment:   Intra-op Plan:   Post-operative Plan:   Informed Consent: I have reviewed the patients History and Physical, chart, labs and discussed the procedure including the risks, benefits and alternatives for the proposed anesthesia with the patient or authorized representative who has indicated his/her understanding and acceptance.     Dental Advisory Given  Plan Discussed with: CRNA  Anesthesia Plan Comments:         Anesthesia Quick Evaluation

## 2021-01-21 LAB — H. PYLORI ANTIBODY, IGG: H Pylori IgG: 0.24 Index Value (ref 0.00–0.79)

## 2021-01-22 LAB — SURGICAL PATHOLOGY

## 2021-01-23 ENCOUNTER — Encounter (HOSPITAL_COMMUNITY): Payer: Self-pay | Admitting: Gastroenterology

## 2021-01-27 DIAGNOSIS — I10 Essential (primary) hypertension: Secondary | ICD-10-CM | POA: Diagnosis not present

## 2021-01-27 DIAGNOSIS — E785 Hyperlipidemia, unspecified: Secondary | ICD-10-CM | POA: Diagnosis not present

## 2021-01-27 DIAGNOSIS — Z0000 Encounter for general adult medical examination without abnormal findings: Secondary | ICD-10-CM | POA: Diagnosis not present

## 2021-01-27 DIAGNOSIS — G4730 Sleep apnea, unspecified: Secondary | ICD-10-CM | POA: Diagnosis not present

## 2021-02-02 DIAGNOSIS — J449 Chronic obstructive pulmonary disease, unspecified: Secondary | ICD-10-CM | POA: Diagnosis not present

## 2021-02-02 DIAGNOSIS — Z23 Encounter for immunization: Secondary | ICD-10-CM | POA: Diagnosis not present

## 2021-02-09 ENCOUNTER — Telehealth (INDEPENDENT_AMBULATORY_CARE_PROVIDER_SITE_OTHER): Payer: Self-pay | Admitting: *Deleted

## 2021-02-09 NOTE — Telephone Encounter (Signed)
Patient reports that he is asymptomatic at this moment, please cancel the referral

## 2021-02-09 NOTE — Telephone Encounter (Signed)
Spoke to scheduler at [facility] and referral has been canceled

## 2021-02-09 NOTE — Telephone Encounter (Signed)
Sharyn Lull from [facility] - she called patient to schedule bacterial overgrowth breath test - patient told her he didn't know anything about this, he was under the impression everything was ok when you called him with endoscopy results - he wants to discuss this with you before he schedules  He can be reached at [phone]

## 2021-02-12 DIAGNOSIS — R110 Nausea: Secondary | ICD-10-CM | POA: Diagnosis not present

## 2021-02-12 DIAGNOSIS — G4700 Insomnia, unspecified: Secondary | ICD-10-CM | POA: Diagnosis not present

## 2021-02-12 DIAGNOSIS — M25519 Pain in unspecified shoulder: Secondary | ICD-10-CM | POA: Diagnosis not present

## 2021-02-12 DIAGNOSIS — M545 Low back pain, unspecified: Secondary | ICD-10-CM | POA: Diagnosis not present

## 2021-02-12 DIAGNOSIS — M130 Polyarthritis, unspecified: Secondary | ICD-10-CM | POA: Diagnosis not present

## 2021-02-12 DIAGNOSIS — M542 Cervicalgia: Secondary | ICD-10-CM | POA: Diagnosis not present

## 2021-02-12 DIAGNOSIS — M25562 Pain in left knee: Secondary | ICD-10-CM | POA: Diagnosis not present

## 2021-02-12 DIAGNOSIS — Z79891 Long term (current) use of opiate analgesic: Secondary | ICD-10-CM | POA: Diagnosis not present

## 2021-02-12 DIAGNOSIS — I10 Essential (primary) hypertension: Secondary | ICD-10-CM | POA: Diagnosis not present

## 2021-02-12 DIAGNOSIS — M79643 Pain in unspecified hand: Secondary | ICD-10-CM | POA: Diagnosis not present

## 2021-02-12 DIAGNOSIS — G4733 Obstructive sleep apnea (adult) (pediatric): Secondary | ICD-10-CM | POA: Diagnosis not present

## 2021-02-12 DIAGNOSIS — K5903 Drug induced constipation: Secondary | ICD-10-CM | POA: Diagnosis not present

## 2021-02-13 ENCOUNTER — Other Ambulatory Visit (HOSPITAL_COMMUNITY): Payer: Medicare Other

## 2021-03-02 NOTE — Progress Notes (Incomplete)
History of Present Illness: Joseph Ellison returns for routine visit to f/u BPH w/ LUTS as well as ED.  Past Medical History:  Diagnosis Date   Arthritis    Back pain, chronic    COPD (chronic obstructive pulmonary disease) (HCC)    Depression    GERD (gastroesophageal reflux disease)    Hypertension    MRSA (methicillin resistant Staphylococcus aureus)    Neurogenic pain, leg    Primary localized osteoarthritis of right knee 12/28/2016    Past Surgical History:  Procedure Laterality Date   BIOPSY  01/20/2021   Procedure: BIOPSY;  Surgeon: Harvel Quale, MD;  Location: [facility];  Service: Gastroenterology;;   COLONOSCOPY WITH PROPOFOL N/A 12/07/2019   Procedure: COLONOSCOPY WITH PROPOFOL;  Surgeon: Harvel Quale, MD;  Location: [facility];  Service: Gastroenterology;  Laterality: N/A;  930   ELBOW SURGERY     ESOPHAGOGASTRODUODENOSCOPY (EGD) WITH PROPOFOL N/A 01/20/2021   Procedure: ESOPHAGOGASTRODUODENOSCOPY (EGD) WITH PROPOFOL;  Surgeon: Harvel Quale, MD;  Location: [facility];  Service: Gastroenterology;  Laterality: N/A;  8:50   NOSE SURGERY     PARTIAL KNEE ARTHROPLASTY Right 12/28/2016   Procedure: UNICOMPARTMENTAL RIGHT KNEE;  Surgeon: Marchia Bond, MD;  Location: [facility];  Service: Orthopedics;  Laterality: Right;   POLYPECTOMY  12/07/2019   Procedure: POLYPECTOMY;  Surgeon: Harvel Quale, MD;  Location: [facility];  Service: Gastroenterology;;    Home Medications:  Allergies as of 03/03/2021       Reactions   Penicillins Hives   Ok with Ancef   Coconut Oil Hives, Swelling        Medication List        Accurate as of March 02, 2021  5:07 PM. If you have any questions, ask your nurse or doctor.          albuterol (2.5 MG/3ML) 0.083% nebulizer solution Commonly known as: PROVENTIL Take 2.5 mg by nebulization every 6 (six) hours as needed for wheezing or shortness of breath.   albuterol 108 (90  Base) MCG/ACT inhaler Commonly known as: VENTOLIN HFA Inhale 1-2 puffs into the lungs every 6 (six) hours as needed for wheezing or shortness of breath.   Belsomra 15 MG Tabs Generic drug: Suvorexant Take 1 tablet by mouth daily as needed for sleep.   budesonide-formoterol 160-4.5 MCG/ACT inhaler Commonly known as: SYMBICORT Inhale 1 puff into the lungs daily as needed (respiratory issues.).   carvedilol 6.25 MG tablet Commonly known as: COREG Take 6.25 mg by mouth 2 (two) times daily.   diazepam 5 MG tablet Commonly known as: VALIUM Take 1 tablet (5 mg total) by mouth 3 (three) times daily. What changed:  when to take this additional instructions   fluticasone 50 MCG/ACT nasal spray Commonly known as: FLONASE Place 1 spray into both nostrils daily.   furosemide 40 MG tablet Commonly known as: LASIX Take 40 mg by mouth every other day.   linaclotide 72 MCG capsule Commonly known as: Linzess Take 1 capsule (72 mcg total) by mouth daily before breakfast. What changed:  when to take this reasons to take this   lisinopril 10 MG tablet Commonly known as: ZESTRIL Take 10 mg by mouth 2 (two) times daily.   omeprazole 40 MG capsule Commonly known as: PRILOSEC Take 1 capsule (40 mg total) by mouth daily.   oxyCODONE-acetaminophen 10-325 MG tablet Commonly known as: PERCOCET Take 1 tablet by mouth every 4 (four) hours as needed for pain.  pravastatin 40 MG tablet Commonly known as: PRAVACHOL Take 40 mg by mouth daily.   sildenafil 100 MG tablet Commonly known as: VIAGRA Take 1 tablet (100 mg total) by mouth daily as needed for erectile dysfunction.   tamsulosin 0.4 MG Caps capsule Commonly known as: FLOMAX Take 1 capsule (0.4 mg total) by mouth daily.   tetrahydrozoline 0.05 % ophthalmic solution Place 1-2 drops into both eyes 3 (three) times daily as needed (dry/irritated eyes.).        Allergies:  Allergies  Allergen Reactions   Penicillins Hives     Ok with Ancef   Coconut Oil Hives and Swelling    No family history on file.  Social History:  reports that he quit smoking about 6 months ago. His smoking use included cigarettes. He smoked an average of .25 packs per day. He has never used smokeless tobacco. He reports that he does not currently use alcohol. He reports that he does not use drugs.  ROS: A complete review of systems was performed.  All systems are negative except for pertinent findings as noted.  Physical Exam:  Vital signs in last 24 hours: There were no vitals taken for this visit. Constitutional:  Alert and oriented, No acute distress Cardiovascular: Regular rate  Respiratory: Normal respiratory effort GI: Abdomen is soft, nontender, nondistended, no abdominal masses. No CVAT.  Genitourinary: Normal male phallus, testes are descended bilaterally and non-tender and without masses, scrotum is normal in appearance without lesions or masses, perineum is normal on inspection. Lymphatic: No lymphadenopathy Neurologic: Grossly intact, no focal deficits Psychiatric: Normal mood and affect  I have reviewed prior pt notes  I have reviewed notes from referring/previous physicians  I have reviewed urinalysis results  I have independently reviewed prior imaging  I have reviewed prior PSA results  I have reviewed prior urine culture   Impression/Assessment:  ***  Plan:  ***

## 2021-03-03 ENCOUNTER — Ambulatory Visit: Payer: Medicare Other | Admitting: Urology

## 2021-04-30 ENCOUNTER — Ambulatory Visit (INDEPENDENT_AMBULATORY_CARE_PROVIDER_SITE_OTHER): Payer: Medicare Other | Admitting: Gastroenterology

## 2021-11-05 ENCOUNTER — Other Ambulatory Visit (INDEPENDENT_AMBULATORY_CARE_PROVIDER_SITE_OTHER): Payer: Self-pay | Admitting: Gastroenterology

## 2021-11-06 ENCOUNTER — Other Ambulatory Visit (INDEPENDENT_AMBULATORY_CARE_PROVIDER_SITE_OTHER): Payer: Self-pay | Admitting: Gastroenterology

## 2022-01-06 ENCOUNTER — Other Ambulatory Visit (INDEPENDENT_AMBULATORY_CARE_PROVIDER_SITE_OTHER): Payer: Self-pay | Admitting: Gastroenterology

## 2022-01-06 ENCOUNTER — Telehealth (INDEPENDENT_AMBULATORY_CARE_PROVIDER_SITE_OTHER): Payer: Self-pay | Admitting: *Deleted

## 2022-01-06 DIAGNOSIS — K5904 Chronic idiopathic constipation: Secondary | ICD-10-CM

## 2022-01-06 MED ORDER — LINACLOTIDE 72 MCG PO CAPS: 72.0000 ug | ORAL_CAPSULE | Freq: Every day | ORAL | 1 refills | Status: DC

## 2022-01-06 NOTE — Telephone Encounter (Signed)
Patient notified refill sent 

## 2022-01-06 NOTE — Telephone Encounter (Signed)
Medication refilled

## 2022-01-06 NOTE — Telephone Encounter (Signed)
Patient called to set up appt. Last seen oct 2022. And needs refill on linzess. Joseph Ellison can you make appt and I will send Dr. Jenetta Downer a message about refill.

## 2022-01-12 ENCOUNTER — Ambulatory Visit: Payer: Medicare Other | Admitting: Urology

## 2022-02-04 ENCOUNTER — Ambulatory Visit (INDEPENDENT_AMBULATORY_CARE_PROVIDER_SITE_OTHER): Payer: Medicare Other | Admitting: Gastroenterology

## 2022-02-04 ENCOUNTER — Encounter (INDEPENDENT_AMBULATORY_CARE_PROVIDER_SITE_OTHER): Payer: Self-pay | Admitting: Gastroenterology

## 2022-02-04 VITALS — BP 98/62 | HR 63 | Temp 97.6°F | Ht 69.0 in | Wt 212.9 lb

## 2022-02-04 DIAGNOSIS — K5903 Drug induced constipation: Secondary | ICD-10-CM | POA: Diagnosis not present

## 2022-02-04 DIAGNOSIS — K2980 Duodenitis without bleeding: Secondary | ICD-10-CM

## 2022-02-04 DIAGNOSIS — R140 Abdominal distension (gaseous): Secondary | ICD-10-CM | POA: Diagnosis not present

## 2022-02-04 NOTE — Progress Notes (Signed)
Referring Provider: No ref. provider found Primary Care Physician:  Leeanne Rio, MD Primary GI Physician: Jenetta Downer   Chief Complaint  Patient presents with   Constipation    Yearly follow up on constipation and gerd.   HPI:   Joseph Ellison is a 66 y.o. male with past medical history of HTN, COPD, CHF, GERD, chronic opiate use, depression   Patient presenting today for follow up of duodenitis/bloating, constipation.  Last seen October 2022, at that time having intermittent bloating, taking gas x with some improvement. Taking linzess 49mg daily with good results. Also with some c/o nausea   Recommended patient schedule EGD, SIBO testing, take gas x for bloating, IBGard, continue Linzess 771m daily. SIBO testing not done as patient felt better prior to test being performed   Present: Patient states that he recently has had issues with his PCP leaving and ran out of some of his medications causing him to feel bad. still having some bloating in his abdomen. Takes gas x and pepto bismol on occasion, though reports he does not require these very often. He did take a second dose of his linzess [id number] due to feeling more bloated. Generally having a BM daily or every other day when he takes one linzess [id number].  Denies abdominal pain. Notes he had an episode of nausea/vomiting earlier this week, emesis x2 one day though he is feeling better and thinks this was related to his recent lack of some of his daily meds.  Denies any rectal bleeding or melena.  Doing well on the omeprazole and denies any breakthrough GERD symptoms. He has had some decrease in appetite and a small amount of weight loss over the past year, 212 lbs today, was 217 in November 2022. He does also note a lot of family losses over the past year or so which has really taken a toll on him mentally.   Last Colonoscopy:12/2019-Small angiodysplasia cecum, 2 mm polyp ascending, diverticulosis. Path with benign colonic  mucosa Last Endoscopy:12/2020 - Normal esophagus. - Z-line regular, 41 cm from the incisors. - Normal stomach. Biopsied-mild chronic gastritis, no h pylori - Duodenitis. Biopsied-chronic duodenitis   Recommendations:  Repeat colonoscopy in 2026  Past Medical History:  Diagnosis Date   Arthritis    Back pain, chronic    COPD (chronic obstructive pulmonary disease) (HCC)    Depression    GERD (gastroesophageal reflux disease)    Hypertension    MRSA (methicillin resistant Staphylococcus aureus)    Neurogenic pain, leg    Primary localized osteoarthritis of right knee 12/28/2016    Past Surgical History:  Procedure Laterality Date   BIOPSY  01/20/2021   Procedure: BIOPSY;  Surgeon: CastHarvel Quale;  [facility];  Service: Gastroenterology;;   COLONOSCOPY WITH PROPOFOL N/A 12/07/2019   Procedure: COLONOSCOPY WITH PROPOFOL;  Surgeon: CastHarvel Quale;  Location: [facility];  Service: Gastroenterology;  Laterality: N/A;  930   ELBOW SURGERY     ESOPHAGOGASTRODUODENOSCOPY (EGD) WITH PROPOFOL N/A 01/20/2021   Procedure: ESOPHAGOGASTRODUODENOSCOPY (EGD) WITH PROPOFOL;  Surgeon: CastHarvel Quale;  [facility]: [facility];  Service: Gastroenterology;  Laterality: N/A;  8:50   NOSE SURGERY     PARTIAL KNEE ARTHROPLASTY Right 12/28/2016   Procedure: UNICOMPARTMENTAL RIGHT KNEE;  Surgeon: LandMarchia Bond;  [facility];  Laterality: Right;   POLYPECTOMY  12/07/2019   Procedure: POLYPECTOMY;  Surgeon: CastHarvel Quale;  [facility];  Service: Gastroenterology;;    Current Outpatient Medications  Medication Sig Dispense Refill   albuterol (PROVENTIL) (2.5 MG/3ML) 0.083% nebulizer solution Take 2.5 mg by nebulization every 6 (six) hours as needed for wheezing or shortness of breath.     albuterol (VENTOLIN HFA) 108 (90 Base) MCG/ACT inhaler Inhale 1-2 puffs into the lungs every 6 (six)  hours as needed for wheezing or shortness of breath.     BELSOMRA 15 MG TABS Take 1 tablet by mouth daily as needed for sleep.     carvedilol (COREG) 6.25 MG tablet Take 6.25 mg by mouth 2 (two) times daily.     celecoxib (CELEBREX) 50 MG capsule Take 50 mg by mouth 2 (two) times daily.     diazepam (VALIUM) 5 MG tablet Take 1 tablet (5 mg total) by mouth 3 (three) times daily. (Patient taking differently: Take 5 mg by mouth every 12 (twelve) hours. anxiety) 30 tablet 0   DULoxetine (CYMBALTA) 60 MG capsule Take 60 mg by mouth daily.     empagliflozin (JARDIANCE) 10 MG TABS tablet Take by mouth daily.     fluticasone (FLONASE) 50 MCG/ACT nasal spray Place 1 spray into both nostrils daily.     furosemide (LASIX) 40 MG tablet Take 40 mg by mouth every other day.     linaclotide (LINZESS) 72 MCG capsule Take 1 capsule (72 mcg total) by mouth daily before breakfast. 90 capsule 1   lisinopril (ZESTRIL) 10 MG tablet Take 10 mg by mouth 2 (two) times daily.     omeprazole (PRILOSEC) 40 MG capsule Take 1 capsule (40 mg total) by mouth daily. 90 capsule 3   oxyCODONE-acetaminophen (PERCOCET) 10-325 MG tablet Take 1 tablet by mouth every 4 (four) hours as needed for pain. 50 tablet 0   pravastatin (PRAVACHOL) 40 MG tablet Take 40 mg by mouth daily.      sildenafil (VIAGRA) 100 MG tablet Take 1 tablet (100 mg total) by mouth daily as needed for erectile dysfunction. 30 tablet 11   tamsulosin (FLOMAX) 0.4 MG CAPS capsule Take 1 capsule (0.4 mg total) by mouth daily. 90 capsule 3   budesonide-formoterol (SYMBICORT) 160-4.5 MCG/ACT inhaler Inhale 1 puff into the lungs daily as needed (respiratory issues.).     No current facility-administered medications for this visit.    Allergies as of 02/04/2022 - Review Complete 01/20/2021  Allergen Reaction Noted   Penicillins Hives 07/21/2013   Coconut (cocos nucifera) Hives and Swelling 01/12/2021    No family history on file.  Social History   Socioeconomic  History   Marital status: Legally Separated    Spouse name: Not on file   Number of children: Not on file   Years of education: Not on file   Highest education level: Not on file  Occupational History   Not on file  Tobacco Use   Smoking status: Former    Packs/day: 0.25    Types: Cigarettes    Quit date: 08/2020    Years since quitting: 1.4   Smokeless tobacco: Never  Vaping Use   Vaping Use: Every day  Substance and Sexual Activity   Alcohol use: Not Currently   Drug use: No   Sexual activity: Not on file  Other Topics Concern   Not on file  Social History Narrative   Not on file   Social Determinants of Health   Financial Resource Strain: Not on file  Food Insecurity: Not on file  Transportation Needs: Not on file  Physical Activity: Not on file  Stress: Not on file  Social Connections: Not on file   Review of systems General: negative for malaise, night sweats, fever, chills, weight loss Neck: Negative for lumps, goiter, pain and significant neck swelling Resp: Negative for cough, wheezing, dyspnea at rest CV: Negative for chest pain, leg swelling, palpitations, orthopnea GI: denies melena, hematochezia, nausea, vomiting, diarrhea, constipation, dysphagia, odyonophagia, early satiety or unintentional weight loss. +abdominal bloating MSK: Negative for joint pain or swelling, back pain, and muscle pain. Derm: Negative for itching or rash Psych: Denies depression, anxiety, memory loss, confusion. No homicidal or suicidal ideation.  Heme: Negative for prolonged bleeding, bruising easily, and swollen nodes. Endocrine: Negative for cold or heat intolerance, polyuria, polydipsia and goiter. Neuro: negative for tremor, gait imbalance, syncope and seizures. The remainder of the review of systems is noncontributory.  Physical Exam: BP (!) 151/102 (BP Location: Right Arm, Patient Position: Sitting, Cuff Size: Large)   Pulse 69   Temp 97.6 F (36.4 C) (Oral)   Ht '5\' 9"'$   (1.753 m)   Wt 212 lb 14.4 oz (96.6 kg)   BMI 31.44 kg/m  General:   Alert and oriented. No distress noted. Pleasant and cooperative.  Head:  Normocephalic and atraumatic. Eyes:  Conjuctiva clear without scleral icterus. Mouth:  Oral mucosa pink and moist. Good dentition. No lesions. Heart: Normal rate and rhythm, s1 and s2 heart sounds present.  Lungs: Clear lung sounds in all lobes. Respirations equal and unlabored. Abdomen:  +BS, soft, non-tender and non-distended. No rebound or guarding. No HSM or masses noted. Derm: No palmar erythema or jaundice Msk:  Symmetrical without gross deformities. Normal posture. Extremities:  Without edema. Neurologic:  Alert and  oriented x4 Psych:  Alert and cooperative. Normal mood and affect.  Invalid input(s): "6 MONTHS"   ASSESSMENT: Joseph Ellison is a 66 y.o. male presenting today for follow up of duodenitis, abdominal bloating and constipation.  Constipation, likely secondary to chronic opiate medications is well managed on linzess 60mg daily. Having a BM every day to every other day. Denies rectal bleeding or melena. Will continue with current regimen, good water intake and plenty of fruits and veggies in diet.   GERD/duodenitis is under control on omeprazole '40mg'$  daily. He denies breakthrough reflux symptoms. 2 isolated episodes of emesis last week but likely related to lack of some of his daily meds. No further n/v. He denies abdominal pain, dysphagia, odynophagia.   He continues to have some abdominal bloating, though feels it is improved from previously. Takes gas x or pepto bismol on occasion for this. We discussed SIBO breath test given ongoing bloating, however, at this time patient wants to hold off on this. I recommmend he continue to use gas x  as needed and can also try IB gard. He may also benefit from starting a daily probiotic. he let me know if he wishes to do SIBO breath.     PLAN:  Continue linzess 761m daily  2.  Continue omeprazole '40mg'$  daily  3. Continue gas x as needed/IBgard  4. Start daily probiotic  5. Pt to make me aware if he wishes to do SIBO breath test   All questions were answered, patient verbalized understanding and is in agreement with plan as outlined above.    Follow Up: 1 year   Ina Poupard L. CaAlver SorrowMSN, APRN, AGNP-C Adult-Gerontology Nurse Practitioner [facility]  I have reviewed the note and agree with the APP's assessment as  described in this progress note  Maylon Peppers, MD Gastroenterology and Hepatology [facility] Gastroenterology

## 2022-02-04 NOTE — Patient Instructions (Signed)
Please continue your linzess and omeprazole once daily You can use gas x as needed for bloating and also can try over the counter IBgard for this I would recommend starting a daily probiotic, look for one with atleast 2-3 strains of bacteria in it If you decide you would like to do the SIBO breath testing at [facility], please let me know  Follow up 1 year

## 2022-02-05 ENCOUNTER — Other Ambulatory Visit (INDEPENDENT_AMBULATORY_CARE_PROVIDER_SITE_OTHER): Payer: Self-pay | Admitting: Gastroenterology

## 2022-02-05 DIAGNOSIS — K5904 Chronic idiopathic constipation: Secondary | ICD-10-CM

## 2022-02-08 NOTE — Telephone Encounter (Signed)
Seen 02/04/22

## 2022-02-17 ENCOUNTER — Other Ambulatory Visit (HOSPITAL_COMMUNITY): Payer: Self-pay | Admitting: Family Medicine

## 2022-02-17 DIAGNOSIS — Z122 Encounter for screening for malignant neoplasm of respiratory organs: Secondary | ICD-10-CM

## 2022-03-17 DIAGNOSIS — I10 Essential (primary) hypertension: Secondary | ICD-10-CM | POA: Diagnosis not present

## 2022-03-17 DIAGNOSIS — I110 Hypertensive heart disease with heart failure: Secondary | ICD-10-CM | POA: Diagnosis not present

## 2022-03-17 DIAGNOSIS — I5032 Chronic diastolic (congestive) heart failure: Secondary | ICD-10-CM | POA: Diagnosis not present

## 2022-03-17 DIAGNOSIS — R7309 Other abnormal glucose: Secondary | ICD-10-CM | POA: Diagnosis not present

## 2022-03-17 DIAGNOSIS — Z87891 Personal history of nicotine dependence: Secondary | ICD-10-CM | POA: Diagnosis not present

## 2022-03-17 DIAGNOSIS — Z0000 Encounter for general adult medical examination without abnormal findings: Secondary | ICD-10-CM | POA: Diagnosis not present

## 2022-03-17 DIAGNOSIS — E041 Nontoxic single thyroid nodule: Secondary | ICD-10-CM | POA: Diagnosis not present

## 2022-03-17 DIAGNOSIS — G894 Chronic pain syndrome: Secondary | ICD-10-CM | POA: Diagnosis not present

## 2022-03-17 DIAGNOSIS — Z23 Encounter for immunization: Secondary | ICD-10-CM | POA: Diagnosis not present

## 2022-03-17 DIAGNOSIS — E785 Hyperlipidemia, unspecified: Secondary | ICD-10-CM | POA: Diagnosis not present

## 2022-03-17 DIAGNOSIS — Z720 Tobacco use: Secondary | ICD-10-CM | POA: Diagnosis not present

## 2022-03-23 DIAGNOSIS — I2584 Coronary atherosclerosis due to calcified coronary lesion: Secondary | ICD-10-CM | POA: Diagnosis not present

## 2022-03-23 DIAGNOSIS — R918 Other nonspecific abnormal finding of lung field: Secondary | ICD-10-CM | POA: Diagnosis not present

## 2022-03-23 DIAGNOSIS — J439 Emphysema, unspecified: Secondary | ICD-10-CM | POA: Diagnosis not present

## 2022-03-23 DIAGNOSIS — Z87891 Personal history of nicotine dependence: Secondary | ICD-10-CM | POA: Diagnosis not present

## 2022-03-23 DIAGNOSIS — Z122 Encounter for screening for malignant neoplasm of respiratory organs: Secondary | ICD-10-CM | POA: Diagnosis not present

## 2022-03-23 DIAGNOSIS — I700 Atherosclerosis of aorta: Secondary | ICD-10-CM | POA: Diagnosis not present

## 2022-03-23 DIAGNOSIS — E041 Nontoxic single thyroid nodule: Secondary | ICD-10-CM | POA: Diagnosis not present

## 2022-03-23 DIAGNOSIS — I2510 Atherosclerotic heart disease of native coronary artery without angina pectoris: Secondary | ICD-10-CM | POA: Diagnosis not present

## 2022-03-24 DIAGNOSIS — M549 Dorsalgia, unspecified: Secondary | ICD-10-CM | POA: Diagnosis not present

## 2022-03-24 DIAGNOSIS — Z79899 Other long term (current) drug therapy: Secondary | ICD-10-CM | POA: Diagnosis not present

## 2022-03-24 DIAGNOSIS — I10 Essential (primary) hypertension: Secondary | ICD-10-CM | POA: Diagnosis not present

## 2022-03-24 DIAGNOSIS — M25512 Pain in left shoulder: Secondary | ICD-10-CM | POA: Diagnosis not present

## 2022-03-24 DIAGNOSIS — M19011 Primary osteoarthritis, right shoulder: Secondary | ICD-10-CM | POA: Diagnosis not present

## 2022-03-24 DIAGNOSIS — M419 Scoliosis, unspecified: Secondary | ICD-10-CM | POA: Diagnosis not present

## 2022-03-24 DIAGNOSIS — E559 Vitamin D deficiency, unspecified: Secondary | ICD-10-CM | POA: Diagnosis not present

## 2022-03-24 DIAGNOSIS — Z0130 Encounter for examination of blood pressure without abnormal findings: Secondary | ICD-10-CM | POA: Diagnosis not present

## 2022-03-24 DIAGNOSIS — M25511 Pain in right shoulder: Secondary | ICD-10-CM | POA: Diagnosis not present

## 2022-03-24 DIAGNOSIS — M47816 Spondylosis without myelopathy or radiculopathy, lumbar region: Secondary | ICD-10-CM | POA: Diagnosis not present

## 2022-03-24 DIAGNOSIS — M19012 Primary osteoarthritis, left shoulder: Secondary | ICD-10-CM | POA: Diagnosis not present

## 2022-03-30 DIAGNOSIS — E041 Nontoxic single thyroid nodule: Secondary | ICD-10-CM | POA: Diagnosis not present

## 2022-03-31 DIAGNOSIS — F32A Depression, unspecified: Secondary | ICD-10-CM | POA: Diagnosis not present

## 2022-03-31 DIAGNOSIS — J449 Chronic obstructive pulmonary disease, unspecified: Secondary | ICD-10-CM | POA: Diagnosis not present

## 2022-03-31 DIAGNOSIS — R911 Solitary pulmonary nodule: Secondary | ICD-10-CM | POA: Diagnosis not present

## 2022-03-31 DIAGNOSIS — E785 Hyperlipidemia, unspecified: Secondary | ICD-10-CM | POA: Diagnosis not present

## 2022-03-31 DIAGNOSIS — E041 Nontoxic single thyroid nodule: Secondary | ICD-10-CM | POA: Diagnosis not present

## 2022-04-05 ENCOUNTER — Telehealth: Payer: Self-pay | Admitting: "Endocrinology

## 2022-04-05 NOTE — Telephone Encounter (Signed)
Left Vm for patient for a referral for thyroid nodule. Referral is under Media if pt calls back to schedule. He can be scheduled with either provider

## 2022-04-06 DIAGNOSIS — Z96651 Presence of right artificial knee joint: Secondary | ICD-10-CM | POA: Diagnosis not present

## 2022-04-06 DIAGNOSIS — G894 Chronic pain syndrome: Secondary | ICD-10-CM | POA: Diagnosis not present

## 2022-04-06 DIAGNOSIS — M25561 Pain in right knee: Secondary | ICD-10-CM | POA: Diagnosis not present

## 2022-04-06 DIAGNOSIS — M25521 Pain in right elbow: Secondary | ICD-10-CM | POA: Diagnosis not present

## 2022-04-06 DIAGNOSIS — Z79891 Long term (current) use of opiate analgesic: Secondary | ICD-10-CM | POA: Diagnosis not present

## 2022-04-06 DIAGNOSIS — Z79899 Other long term (current) drug therapy: Secondary | ICD-10-CM | POA: Diagnosis not present

## 2022-04-16 ENCOUNTER — Other Ambulatory Visit (INDEPENDENT_AMBULATORY_CARE_PROVIDER_SITE_OTHER): Payer: Self-pay | Admitting: Gastroenterology

## 2022-04-16 DIAGNOSIS — Z87891 Personal history of nicotine dependence: Secondary | ICD-10-CM | POA: Diagnosis not present

## 2022-04-16 DIAGNOSIS — R911 Solitary pulmonary nodule: Secondary | ICD-10-CM | POA: Diagnosis not present

## 2022-04-16 DIAGNOSIS — K5904 Chronic idiopathic constipation: Secondary | ICD-10-CM

## 2022-04-16 DIAGNOSIS — F32A Depression, unspecified: Secondary | ICD-10-CM | POA: Diagnosis not present

## 2022-05-04 DIAGNOSIS — G894 Chronic pain syndrome: Secondary | ICD-10-CM | POA: Diagnosis not present

## 2022-05-04 DIAGNOSIS — Z96651 Presence of right artificial knee joint: Secondary | ICD-10-CM | POA: Diagnosis not present

## 2022-05-04 DIAGNOSIS — Z79899 Other long term (current) drug therapy: Secondary | ICD-10-CM | POA: Diagnosis not present

## 2022-05-04 DIAGNOSIS — M25561 Pain in right knee: Secondary | ICD-10-CM | POA: Diagnosis not present

## 2022-05-04 DIAGNOSIS — M545 Low back pain, unspecified: Secondary | ICD-10-CM | POA: Diagnosis not present

## 2022-05-04 DIAGNOSIS — M25521 Pain in right elbow: Secondary | ICD-10-CM | POA: Diagnosis not present

## 2022-05-04 DIAGNOSIS — M25562 Pain in left knee: Secondary | ICD-10-CM | POA: Diagnosis not present

## 2022-05-06 DIAGNOSIS — I10 Essential (primary) hypertension: Secondary | ICD-10-CM | POA: Diagnosis not present

## 2022-05-06 DIAGNOSIS — I5032 Chronic diastolic (congestive) heart failure: Secondary | ICD-10-CM | POA: Diagnosis not present

## 2022-05-06 DIAGNOSIS — I2510 Atherosclerotic heart disease of native coronary artery without angina pectoris: Secondary | ICD-10-CM | POA: Diagnosis not present

## 2022-05-06 DIAGNOSIS — E785 Hyperlipidemia, unspecified: Secondary | ICD-10-CM | POA: Diagnosis not present

## 2022-05-25 ENCOUNTER — Encounter: Payer: Self-pay | Admitting: "Endocrinology

## 2022-06-09 ENCOUNTER — Ambulatory Visit: Payer: 59 | Admitting: "Endocrinology

## 2022-06-09 DIAGNOSIS — G894 Chronic pain syndrome: Secondary | ICD-10-CM | POA: Diagnosis not present

## 2022-06-09 DIAGNOSIS — M25562 Pain in left knee: Secondary | ICD-10-CM | POA: Diagnosis not present

## 2022-06-09 DIAGNOSIS — Z79891 Long term (current) use of opiate analgesic: Secondary | ICD-10-CM | POA: Diagnosis not present

## 2022-06-09 DIAGNOSIS — Z79899 Other long term (current) drug therapy: Secondary | ICD-10-CM | POA: Diagnosis not present

## 2022-06-09 DIAGNOSIS — M25521 Pain in right elbow: Secondary | ICD-10-CM | POA: Diagnosis not present

## 2022-06-09 DIAGNOSIS — Z96651 Presence of right artificial knee joint: Secondary | ICD-10-CM | POA: Diagnosis not present

## 2022-06-09 DIAGNOSIS — M25561 Pain in right knee: Secondary | ICD-10-CM | POA: Diagnosis not present

## 2022-06-10 DIAGNOSIS — F32A Depression, unspecified: Secondary | ICD-10-CM | POA: Diagnosis not present

## 2022-07-01 ENCOUNTER — Encounter: Payer: Self-pay | Admitting: "Endocrinology

## 2022-07-07 DIAGNOSIS — Z79899 Other long term (current) drug therapy: Secondary | ICD-10-CM | POA: Diagnosis not present

## 2022-07-07 DIAGNOSIS — M25561 Pain in right knee: Secondary | ICD-10-CM | POA: Diagnosis not present

## 2022-07-07 DIAGNOSIS — Z96651 Presence of right artificial knee joint: Secondary | ICD-10-CM | POA: Diagnosis not present

## 2022-07-07 DIAGNOSIS — M545 Low back pain, unspecified: Secondary | ICD-10-CM | POA: Diagnosis not present

## 2022-07-07 DIAGNOSIS — M25562 Pain in left knee: Secondary | ICD-10-CM | POA: Diagnosis not present

## 2022-07-07 DIAGNOSIS — M25521 Pain in right elbow: Secondary | ICD-10-CM | POA: Diagnosis not present

## 2022-07-07 DIAGNOSIS — G894 Chronic pain syndrome: Secondary | ICD-10-CM | POA: Diagnosis not present

## 2022-08-04 DIAGNOSIS — M25521 Pain in right elbow: Secondary | ICD-10-CM | POA: Diagnosis not present

## 2022-08-04 DIAGNOSIS — Z79899 Other long term (current) drug therapy: Secondary | ICD-10-CM | POA: Diagnosis not present

## 2022-08-04 DIAGNOSIS — M25562 Pain in left knee: Secondary | ICD-10-CM | POA: Diagnosis not present

## 2022-08-04 DIAGNOSIS — Z79891 Long term (current) use of opiate analgesic: Secondary | ICD-10-CM | POA: Diagnosis not present

## 2022-08-04 DIAGNOSIS — G894 Chronic pain syndrome: Secondary | ICD-10-CM | POA: Diagnosis not present

## 2022-08-04 DIAGNOSIS — Z96651 Presence of right artificial knee joint: Secondary | ICD-10-CM | POA: Diagnosis not present

## 2022-08-04 DIAGNOSIS — M25561 Pain in right knee: Secondary | ICD-10-CM | POA: Diagnosis not present

## 2022-08-04 DIAGNOSIS — M545 Low back pain, unspecified: Secondary | ICD-10-CM | POA: Diagnosis not present

## 2022-08-20 DIAGNOSIS — I10 Essential (primary) hypertension: Secondary | ICD-10-CM | POA: Diagnosis not present

## 2022-08-31 ENCOUNTER — Ambulatory Visit: Payer: 59 | Admitting: "Endocrinology

## 2022-09-01 DIAGNOSIS — M25562 Pain in left knee: Secondary | ICD-10-CM | POA: Diagnosis not present

## 2022-09-01 DIAGNOSIS — Z79899 Other long term (current) drug therapy: Secondary | ICD-10-CM | POA: Diagnosis not present

## 2022-09-01 DIAGNOSIS — G894 Chronic pain syndrome: Secondary | ICD-10-CM | POA: Diagnosis not present

## 2022-09-01 DIAGNOSIS — M545 Low back pain, unspecified: Secondary | ICD-10-CM | POA: Diagnosis not present

## 2022-09-01 DIAGNOSIS — M25561 Pain in right knee: Secondary | ICD-10-CM | POA: Diagnosis not present

## 2022-09-01 DIAGNOSIS — M25521 Pain in right elbow: Secondary | ICD-10-CM | POA: Diagnosis not present

## 2022-09-01 DIAGNOSIS — Z96651 Presence of right artificial knee joint: Secondary | ICD-10-CM | POA: Diagnosis not present

## 2022-09-02 DIAGNOSIS — F32A Depression, unspecified: Secondary | ICD-10-CM | POA: Diagnosis not present

## 2022-09-28 DIAGNOSIS — G894 Chronic pain syndrome: Secondary | ICD-10-CM | POA: Diagnosis not present

## 2022-09-28 DIAGNOSIS — Z79891 Long term (current) use of opiate analgesic: Secondary | ICD-10-CM | POA: Diagnosis not present

## 2022-09-28 DIAGNOSIS — M25561 Pain in right knee: Secondary | ICD-10-CM | POA: Diagnosis not present

## 2022-09-28 DIAGNOSIS — Z96651 Presence of right artificial knee joint: Secondary | ICD-10-CM | POA: Diagnosis not present

## 2022-09-28 DIAGNOSIS — Z79899 Other long term (current) drug therapy: Secondary | ICD-10-CM | POA: Diagnosis not present

## 2022-09-28 DIAGNOSIS — M545 Low back pain, unspecified: Secondary | ICD-10-CM | POA: Diagnosis not present

## 2022-09-28 DIAGNOSIS — M25521 Pain in right elbow: Secondary | ICD-10-CM | POA: Diagnosis not present

## 2022-09-28 DIAGNOSIS — M25562 Pain in left knee: Secondary | ICD-10-CM | POA: Diagnosis not present

## 2022-10-26 DIAGNOSIS — M545 Low back pain, unspecified: Secondary | ICD-10-CM | POA: Diagnosis not present

## 2022-10-26 DIAGNOSIS — Z79899 Other long term (current) drug therapy: Secondary | ICD-10-CM | POA: Diagnosis not present

## 2022-10-26 DIAGNOSIS — M25562 Pain in left knee: Secondary | ICD-10-CM | POA: Diagnosis not present

## 2022-10-26 DIAGNOSIS — M25521 Pain in right elbow: Secondary | ICD-10-CM | POA: Diagnosis not present

## 2022-10-26 DIAGNOSIS — Z96651 Presence of right artificial knee joint: Secondary | ICD-10-CM | POA: Diagnosis not present

## 2022-10-26 DIAGNOSIS — M25561 Pain in right knee: Secondary | ICD-10-CM | POA: Diagnosis not present

## 2022-10-26 DIAGNOSIS — G894 Chronic pain syndrome: Secondary | ICD-10-CM | POA: Diagnosis not present

## 2022-11-11 DIAGNOSIS — Z87891 Personal history of nicotine dependence: Secondary | ICD-10-CM | POA: Diagnosis not present

## 2022-11-11 DIAGNOSIS — J449 Chronic obstructive pulmonary disease, unspecified: Secondary | ICD-10-CM | POA: Diagnosis not present

## 2022-11-11 DIAGNOSIS — R911 Solitary pulmonary nodule: Secondary | ICD-10-CM | POA: Diagnosis not present

## 2022-11-23 DIAGNOSIS — Z880 Allergy status to penicillin: Secondary | ICD-10-CM | POA: Diagnosis not present

## 2022-11-23 DIAGNOSIS — I509 Heart failure, unspecified: Secondary | ICD-10-CM | POA: Diagnosis not present

## 2022-11-23 DIAGNOSIS — Z7982 Long term (current) use of aspirin: Secondary | ICD-10-CM | POA: Diagnosis not present

## 2022-11-23 DIAGNOSIS — F17290 Nicotine dependence, other tobacco product, uncomplicated: Secondary | ICD-10-CM | POA: Diagnosis not present

## 2022-11-23 DIAGNOSIS — I110 Hypertensive heart disease with heart failure: Secondary | ICD-10-CM | POA: Diagnosis not present

## 2022-11-23 DIAGNOSIS — J449 Chronic obstructive pulmonary disease, unspecified: Secondary | ICD-10-CM | POA: Diagnosis not present

## 2022-11-23 DIAGNOSIS — Z7951 Long term (current) use of inhaled steroids: Secondary | ICD-10-CM | POA: Diagnosis not present

## 2022-11-23 DIAGNOSIS — Z79899 Other long term (current) drug therapy: Secondary | ICD-10-CM | POA: Diagnosis not present

## 2022-11-24 DIAGNOSIS — J410 Simple chronic bronchitis: Secondary | ICD-10-CM | POA: Diagnosis not present

## 2022-11-24 DIAGNOSIS — R911 Solitary pulmonary nodule: Secondary | ICD-10-CM | POA: Diagnosis not present

## 2022-12-02 DIAGNOSIS — R0683 Snoring: Secondary | ICD-10-CM | POA: Diagnosis not present

## 2022-12-02 DIAGNOSIS — G4733 Obstructive sleep apnea (adult) (pediatric): Secondary | ICD-10-CM | POA: Diagnosis not present

## 2022-12-02 DIAGNOSIS — I2510 Atherosclerotic heart disease of native coronary artery without angina pectoris: Secondary | ICD-10-CM | POA: Diagnosis not present

## 2022-12-02 DIAGNOSIS — I10 Essential (primary) hypertension: Secondary | ICD-10-CM | POA: Diagnosis not present

## 2022-12-02 DIAGNOSIS — I5032 Chronic diastolic (congestive) heart failure: Secondary | ICD-10-CM | POA: Diagnosis not present

## 2023-02-07 ENCOUNTER — Ambulatory Visit (INDEPENDENT_AMBULATORY_CARE_PROVIDER_SITE_OTHER): Payer: Medicare Other | Admitting: Gastroenterology

## 2023-06-01 ENCOUNTER — Encounter (INDEPENDENT_AMBULATORY_CARE_PROVIDER_SITE_OTHER): Payer: Self-pay | Admitting: *Deleted

## 2023-06-09 ENCOUNTER — Encounter (INDEPENDENT_AMBULATORY_CARE_PROVIDER_SITE_OTHER): Payer: Self-pay | Admitting: *Deleted

## 2023-07-05 ENCOUNTER — Encounter (INDEPENDENT_AMBULATORY_CARE_PROVIDER_SITE_OTHER): Admitting: Gastroenterology

## 2023-08-18 ENCOUNTER — Telehealth (INDEPENDENT_AMBULATORY_CARE_PROVIDER_SITE_OTHER): Payer: Self-pay | Admitting: Gastroenterology

## 2023-08-18 ENCOUNTER — Encounter (INDEPENDENT_AMBULATORY_CARE_PROVIDER_SITE_OTHER): Payer: Self-pay | Admitting: Gastroenterology

## 2023-08-18 ENCOUNTER — Ambulatory Visit (INDEPENDENT_AMBULATORY_CARE_PROVIDER_SITE_OTHER): Admitting: Gastroenterology

## 2023-08-18 VITALS — BP 126/82 | HR 75 | Temp 97.1°F | Ht 69.0 in | Wt 226.1 lb

## 2023-08-18 DIAGNOSIS — K760 Fatty (change of) liver, not elsewhere classified: Secondary | ICD-10-CM | POA: Diagnosis not present

## 2023-08-18 DIAGNOSIS — K5903 Drug induced constipation: Secondary | ICD-10-CM

## 2023-08-18 DIAGNOSIS — R1030 Lower abdominal pain, unspecified: Secondary | ICD-10-CM | POA: Diagnosis not present

## 2023-08-18 DIAGNOSIS — R933 Abnormal findings on diagnostic imaging of other parts of digestive tract: Secondary | ICD-10-CM | POA: Insufficient documentation

## 2023-08-18 DIAGNOSIS — T402X5A Adverse effect of other opioids, initial encounter: Secondary | ICD-10-CM

## 2023-08-18 NOTE — H&P (View-Only) (Signed)
 Referring Provider: Avelina Bode, DO Primary Care Physician:  Avelina Bode, DO Primary GI Physician: Dr. Sammi Crick   Chief Complaint  Patient presents with   Abdominal Pain    Patient here today as he says he was having lower abdominal pain, which has since subsided. Patient says he had imaging done at [facility] around three months ago.    HPI:   Joseph Ellison is a 68 y.o. male with past medical history of HTN, COPD, CHF, GERD, chronic opiate use, depression   Patient presenting today for:  Abdominal pain Abnormal CT of the colon Hepatic steatosis with normal LFTs  Drug induced constipation  Last seen November 2023, at that time, having bloating, taking gas x, pepto. Having constipation, taking linzess  72mcg daily, episode of vomiting x2 a few weeks earlier, GERD well controlled on omeprazole .   Recommended to continue linzess  72mcg daily, continue omeprazole  40mg  daily, continue gas x /IBgard, PRN, start daily probiotic, consider SIBO breath testing(pt declined).  CT A/P with contrast on 04/11/23 Approximately 8 cm segment of mildly thickened sigmoid colon  containing several diverticula. No pericolonic fat stranding or  fluid. Findings may represent a nonspecific mild colitis or sequela of chronic diverticulitis.  2. Moderate volume stool throughout the colon.  3. Small peripheral airspace opacity in the right lower lobe, new  since 03/30/2023, likely infectious or inflammatory.  4. Indeterminate density 2.4 cm rounded right renal lesion, possibly  a hemorrhagic or proteinaceous cyst, however a solid renal mass is  not excluded. Further evaluation with nonemergent renal ultrasound is recommended.  5. Mild hepatic steatosis.   Present:  Began having lower abdominal pain in February that came on suddenly. He has constipation at baseline for which he takes linzess  72mcg PRN, was previously having 1 BM per day but occasionally will have to repeat his dose. He felt more  constipated when abdominal pain began. No rectal bleeding or melena. States after CT he was advised to take linzess  daily, started on colace and did an enema. Reports that he felt better thereafter. Not currently on colace, taking linzess  72mcg PRN again, has not had a BM in about 2 days so he recently resumed linzess  72mcg daily. Was sick last week, felt nauseated and had watery diarrhea but this resolved. Now feeling more constipated. No changes in appetite or weight loss.   Last labs with CBC in 2025 hgb 14.4, plt 226k CMP grossly unremarkable other than BUN 23, LFTs WNL  TSH 2/10 0.818 T4 0.97   Last Colonoscopy:12/2019-Small angiodysplasia cecum, 2 mm polyp ascending, diverticulosis. Path with benign colonic mucosa Last Endoscopy:12/2020 - Normal esophagus. - Z-line regular, 41 cm from the incisors. - Normal stomach. Biopsied-mild chronic gastritis, no h pylori - Duodenitis. Biopsied-chronic duodenitis    Recommendations:  Repeat colonoscopy in 2026 [facility] Weights   08/18/23 0915  Weight: 226 lb 1.6 oz (102.6 kg)     Past Medical History:  Diagnosis Date   Arthritis    Back pain, chronic    COPD (chronic obstructive pulmonary disease) (HCC)    Depression    GERD (gastroesophageal reflux disease)    Hypertension    MRSA (methicillin resistant Staphylococcus aureus)    Neurogenic pain, leg    Primary localized osteoarthritis of right knee 12/28/2016    Past Surgical History:  Procedure Laterality Date   BIOPSY  01/20/2021   Procedure: BIOPSY;  Surgeon: Urban Garden, MD;  Location: [facility];  Service: Gastroenterology;;   COLONOSCOPY WITH PROPOFOL   N/A 12/07/2019   Procedure: COLONOSCOPY WITH PROPOFOL ;  Surgeon: Urban Garden, MD;  Location: [facility];  Service: Gastroenterology;  Laterality: N/A;  930   ELBOW SURGERY     ESOPHAGOGASTRODUODENOSCOPY (EGD) WITH PROPOFOL  N/A 01/20/2021   Procedure: ESOPHAGOGASTRODUODENOSCOPY (EGD) WITH PROPOFOL ;   Surgeon: Urban Garden, MD;  Location: [facility];  Service: Gastroenterology;  Laterality: N/A;  8:50   NOSE SURGERY     PARTIAL KNEE ARTHROPLASTY Right 12/28/2016   Procedure: UNICOMPARTMENTAL RIGHT KNEE;  Surgeon: Osa Blase, MD;  Location: [facility];  Service: Orthopedics;  Laterality: Right;   POLYPECTOMY  12/07/2019   Procedure: POLYPECTOMY;  Surgeon: Umberto Ganong, Bearl Limes, MD;  Location: [facility];  Service: Gastroenterology;;    Current Outpatient Medications  Medication Sig Dispense Refill   albuterol  (PROVENTIL ) (2.5 MG/3ML) 0.083% nebulizer solution Take 2.5 mg by nebulization every 6 (six) hours as needed for wheezing or shortness of breath.     albuterol  (VENTOLIN  HFA) 108 (90 Base) MCG/ACT inhaler Inhale 1-2 puffs into the lungs every 6 (six) hours as needed for wheezing or shortness of breath.     BELSOMRA 15 MG TABS Take 1 tablet by mouth daily as needed for sleep.     budesonide-formoterol (SYMBICORT) 160-4.5 MCG/ACT inhaler Inhale 1 puff into the lungs daily as needed (respiratory issues.).     carvedilol (COREG) 6.25 MG tablet Take 6.25 mg by mouth 2 (two) times daily.     clotrimazole-betamethasone (LOTRISONE) cream Apply 1 Application topically 2 (two) times daily.     dicyclomine (BENTYL) 10 MG capsule Take 10 mg by mouth 4 (four) times daily as needed for spasms.     empagliflozin (JARDIANCE) 10 MG TABS tablet Take by mouth daily.     fluticasone  (FLONASE ) 50 MCG/ACT nasal spray Place 1 spray into both nostrils daily.     furosemide (LASIX) 40 MG tablet Take 40 mg by mouth every other day. (Patient taking differently: Take 40 mg by mouth as needed.)     lidocaine  (LIDODERM ) 5 % Place 1 patch onto the skin daily. Remove & Discard patch within 12 hours or as needed.     LINZESS  72 MCG capsule TAKE 1 CAPSULE BY MOUTH DAILY BEFORE BREAKFAST. (Patient taking differently: Take 72 mcg by mouth as needed.) 30 capsule 0   lisinopril  (ZESTRIL ) 10 MG tablet  Take 10 mg by mouth 2 (two) times daily. (Patient taking differently: Take 30 mg by mouth daily.)     omeprazole  (PRILOSEC) 40 MG capsule Take 1 capsule (40 mg total) by mouth daily. 90 capsule 3   oxyCODONE -acetaminophen  (PERCOCET) 10-325 MG tablet Take 1 tablet by mouth every 4 (four) hours as needed for pain. 50 tablet 0   pravastatin (PRAVACHOL) 40 MG tablet Take 40 mg by mouth daily.      risperiDONE (RISPERDAL M-TABS) 1 MG disintegrating tablet Take 1.5 mg by mouth 2 (two) times daily.     tamsulosin  (FLOMAX ) 0.4 MG CAPS capsule Take 1 capsule (0.4 mg total) by mouth daily. 90 capsule 3   celecoxib (CELEBREX) 50 MG capsule Take 50 mg by mouth 2 (two) times daily. (Patient not taking: Reported on 08/18/2023)     diazepam  (VALIUM ) 5 MG tablet Take 1 tablet (5 mg total) by mouth 3 (three) times daily. (Patient taking differently: Take 5 mg by mouth every 12 (twelve) hours. anxiety) 30 tablet 0   DULoxetine (CYMBALTA) 60 MG capsule Take 60 mg by mouth daily.     sildenafil  (  VIAGRA ) 100 MG tablet Take 1 tablet (100 mg total) by mouth daily as needed for erectile dysfunction. 30 tablet 11   No current facility-administered medications for this visit.    Allergies as of 08/18/2023 - Review Complete 08/18/2023  Allergen Reaction Noted   Penicillins Hives 07/21/2013   Coconut (cocos nucifera) Hives and Swelling 01/12/2021    Social History   Socioeconomic History   Marital status: Legally Separated    Spouse name: Not on file   Number of children: Not on file   Years of education: Not on file   Highest education level: Not on file  Occupational History   Not on file  Tobacco Use   Smoking status: Former    Current packs/day: 0.00    Types: Cigarettes    Quit date: 08/2020    Years since quitting: 3.0   Smokeless tobacco: Never  Vaping Use   Vaping status: Every Day  Substance and Sexual Activity   Alcohol  use: Not Currently   Drug use: No   Sexual activity: Not on file  Other  Topics Concern   Not on file  Social History Narrative   Not on file   Social Drivers of Health   Financial Resource Strain: Low Risk  (11/24/2022)   Received from [facility]   Overall Financial Resource Strain (CARDIA)    Difficulty of Paying Living Expenses: Not hard at all  Food Insecurity: No Food Insecurity (04/18/2023)   Received from [facility]   Hunger Vital Sign    Worried About Running Out of Food in the Last Year: Never true    Ran Out of Food in the Last Year: Never true  Transportation Needs: No Transportation Needs (04/18/2023)   Received from [facility] - Transportation    Lack of Transportation (Medical): No    Lack of Transportation (Non-Medical): No  Physical Activity: Insufficiently Active (12/22/2022)   Received from [facility]   Exercise Vital Sign    Days of Exercise per Week: 3 days    Minutes of Exercise per Session: 30 min  Stress: No Stress Concern Present (04/18/2023)   Received from [facility] - Occupational Stress Questionnaire    Feeling of Stress : Not at all  Recent Concern: Stress - Stress Concern Present (02/28/2023)   Received from [facility] - Occupational Stress Questionnaire    Feeling of Stress : Rather much  Social Connections: Socially Isolated (12/22/2022)   Received from [facility]   Social Connection and Isolation Panel    Frequency of Communication with Friends and Family: Three times a week    Frequency of Social Gatherings with Friends and Family: Three times a week    Attends Religious Services: Never    Active Member of Clubs or Organizations: No    Attends Engineer, structural: Never    Marital Status: Never married    Review of systems General: negative for malaise, night sweats, fever, chills, weight los Neck: Negative for lumps, goiter, pain and significant neck swelling Resp:  Negative for cough, wheezing, dyspnea at rest CV: Negative for chest pain, leg swelling, palpitations, orthopnea GI: denies melena, hematochezia, nausea, vomiting, diarrhea, dysphagia, odyonophagia, early satiety or unintentional weight loss. +constipation  MSK: Negative for joint pain or swelling, back pain, and muscle pain. Derm: Negative for itching or rash Psych: Denies depression, anxiety, memory  loss, confusion. No homicidal or suicidal ideation.  Heme: Negative for prolonged bleeding, bruising easily, and swollen nodes. Endocrine: Negative for cold or heat intolerance, polyuria, polydipsia and goiter. Neuro: negative for tremor, gait imbalance, syncope and seizures. The remainder of the review of systems is noncontributory.  Physical Exam: BP 126/82 (BP Location: Left Arm, Patient Position: Sitting, Cuff Size: Large)   Pulse 75   Temp (!) 97.1 F (36.2 C) (Temporal)   Ht 5' 9 (1.753 m)   Wt 226 lb 1.6 oz (102.6 kg)   BMI 33.39 kg/m  General:   Alert and oriented. No distress noted. Pleasant and cooperative.  Head:  Normocephalic and atraumatic. Eyes:  Conjuctiva clear without scleral icterus. Mouth:  Oral mucosa pink and moist. Good dentition. No lesions. Heart: Normal rate and rhythm, s1 and s2 heart sounds present.  Lungs: Clear lung sounds in all lobes. Respirations equal and unlabored. Abdomen:  +BS, soft, non-tender and non-distended. No rebound or guarding. No HSM or masses noted. +umbilical hernia present, soft Derm: No palmar erythema or jaundice Msk:  Symmetrical without gross deformities. Normal posture. Extremities:  Without edema. Neurologic:  Alert and  oriented x4 Psych:  Alert and cooperative. Normal mood and affect.  Invalid input(s): 6 MONTHS   ASSESSMENT: Joseph Ellison is a 68 y.o. male presenting today for abdominal pain, abnormal CT of the colon, hepatic steatosis, drug induced constipation   Abdominal pain/abnormal CT of colon: Lower  abdominal pain in February, prompting CT imaging by PCP which showed a thickened portion of sigmoid colon, query colitis vs. SCAD. Also showed evidence of constipation. He was started on bowel regimen by PCP which improved his pain. Feeling much better now. Denies rectal bleeding or melena. Last TCS in 2021. Recommend proceeding with colonoscopy for further evaluation of abnormal colon on imaging.   Drug induced constipation: has used linzess  PRN off and on, seems to do better with more regular dosing, Should continue linzess  72mcg daily for now.  Hepatic steatosis: noted on recent CT. Plt count and LFTs WNL. We discussed importance of routine exercise, healthy diet (mediterranean) and good weight management with goal of overall decrease in weight of about 7%. We discussed potential progression to NASH or cirrhosis and importance of prevention of these with the above interventions. Reassuringly FIB 4 score is 1.06 likely excluding any advanced fibrosis.    PLAN:  -schedule colonoscopy ASA III, 2 day prep due to opiates -continue linzess  72mcg, take this daily -Increase water  intake, aim for atleast 64 oz per day -Increase fruits, veggies and whole grains, kiwi and prunes are especially good for constipation -mediterranean diet -routine exercise -good weight management, decrease weight by 7%  All questions were answered, patient verbalized understanding and is in agreement with plan as outlined above.    Follow Up: TBD after colonoscopy   Jaleesa Cervi L. Adrien Alberta, MSN, APRN, AGNP-C Adult-Gerontology Nurse Practitioner [facility]  I have reviewed the note and agree with the APP's assessment as described in this progress note  Samantha Cress, MD Gastroenterology and Hepatology [facility]

## 2023-08-18 NOTE — Telephone Encounter (Signed)
 [facility]  to schedule TCS with Dr.Castaneda ASA 3

## 2023-08-18 NOTE — Patient Instructions (Addendum)
-  schedule colonoscopy  -continue linzess  72mcg, take this daily -Increase water  intake, aim for atleast 64 oz per day -Increase fruits, veggies and whole grains, kiwi and prunes are especially good for constipation -mediterranean diet is liver healthy and good for weight management  -make sure to get routine exercise -good weight management, decrease weight by 7%  Follow up will be determined after colonoscopy  It was a pleasure to see you today. I want to create trusting relationships with patients and provide genuine, compassionate, and quality care. I truly value your feedback! please be on the lookout for a survey regarding your visit with me today. I appreciate your input about our visit and your time in completing this!    Benz Vandenberghe L. Kai Calico, MSN, APRN, AGNP-C Adult-Gerontology Nurse Practitioner [facility] at [facility]

## 2023-08-18 NOTE — Progress Notes (Addendum)
 Referring Provider: Avelina Bode, DO Primary Care Physician:  Avelina Bode, DO Primary GI Physician: Dr. Sammi Crick   Chief Complaint  Patient presents with   Abdominal Pain    Patient here today as he says he was having lower abdominal pain, which has since subsided. Patient says he had imaging done at [facility] around three months ago.    HPI:   Joseph Ellison is a 68 y.o. male with past medical history of HTN, COPD, CHF, GERD, chronic opiate use, depression   Patient presenting today for:  Abdominal pain Abnormal CT of the colon Hepatic steatosis with normal LFTs  Drug induced constipation  Last seen November 2023, at that time, having bloating, taking gas x, pepto. Having constipation, taking linzess  72mcg daily, episode of vomiting x2 a few weeks earlier, GERD well controlled on omeprazole .   Recommended to continue linzess  72mcg daily, continue omeprazole  40mg  daily, continue gas x /IBgard, PRN, start daily probiotic, consider SIBO breath testing(pt declined).  CT A/P with contrast on 04/11/23 Approximately 8 cm segment of mildly thickened sigmoid colon  containing several diverticula. No pericolonic fat stranding or  fluid. Findings may represent a nonspecific mild colitis or sequela of chronic diverticulitis.  2. Moderate volume stool throughout the colon.  3. Small peripheral airspace opacity in the right lower lobe, new  since 03/30/2023, likely infectious or inflammatory.  4. Indeterminate density 2.4 cm rounded right renal lesion, possibly  a hemorrhagic or proteinaceous cyst, however a solid renal mass is  not excluded. Further evaluation with nonemergent renal ultrasound is recommended.  5. Mild hepatic steatosis.   Present:  Began having lower abdominal pain in February that came on suddenly. He has constipation at baseline for which he takes linzess  72mcg PRN, was previously having 1 BM per day but occasionally will have to repeat his dose. He felt more  constipated when abdominal pain began. No rectal bleeding or melena. States after CT he was advised to take linzess  daily, started on colace and did an enema. Reports that he felt better thereafter. Not currently on colace, taking linzess  72mcg PRN again, has not had a BM in about 2 days so he recently resumed linzess  72mcg daily. Was sick last week, felt nauseated and had watery diarrhea but this resolved. Now feeling more constipated. No changes in appetite or weight loss.   Last labs with CBC in 2025 hgb 14.4, plt 226k CMP grossly unremarkable other than BUN 23, LFTs WNL  TSH 2/10 0.818 T4 0.97   Last Colonoscopy:12/2019-Small angiodysplasia cecum, 2 mm polyp ascending, diverticulosis. Path with benign colonic mucosa Last Endoscopy:12/2020 - Normal esophagus. - Z-line regular, 41 cm from the incisors. - Normal stomach. Biopsied-mild chronic gastritis, no h pylori - Duodenitis. Biopsied-chronic duodenitis    Recommendations:  Repeat colonoscopy in 2026 [facility] Weights   08/18/23 0915  Weight: 226 lb 1.6 oz (102.6 kg)     Past Medical History:  Diagnosis Date   Arthritis    Back pain, chronic    COPD (chronic obstructive pulmonary disease) (HCC)    Depression    GERD (gastroesophageal reflux disease)    Hypertension    MRSA (methicillin resistant Staphylococcus aureus)    Neurogenic pain, leg    Primary localized osteoarthritis of right knee 12/28/2016    Past Surgical History:  Procedure Laterality Date   BIOPSY  01/20/2021   Procedure: BIOPSY;  Surgeon: Urban Garden, MD;  Location: [facility];  Service: Gastroenterology;;   COLONOSCOPY WITH PROPOFOL   N/A 12/07/2019   Procedure: COLONOSCOPY WITH PROPOFOL ;  Surgeon: Urban Garden, MD;  Location: [facility];  Service: Gastroenterology;  Laterality: N/A;  930   ELBOW SURGERY     ESOPHAGOGASTRODUODENOSCOPY (EGD) WITH PROPOFOL  N/A 01/20/2021   Procedure: ESOPHAGOGASTRODUODENOSCOPY (EGD) WITH PROPOFOL ;   Surgeon: Urban Garden, MD;  Location: [facility];  Service: Gastroenterology;  Laterality: N/A;  8:50   NOSE SURGERY     PARTIAL KNEE ARTHROPLASTY Right 12/28/2016   Procedure: UNICOMPARTMENTAL RIGHT KNEE;  Surgeon: Osa Blase, MD;  Location: [facility];  Service: Orthopedics;  Laterality: Right;   POLYPECTOMY  12/07/2019   Procedure: POLYPECTOMY;  Surgeon: Umberto Ganong, Bearl Limes, MD;  Location: [facility];  Service: Gastroenterology;;    Current Outpatient Medications  Medication Sig Dispense Refill   albuterol  (PROVENTIL ) (2.5 MG/3ML) 0.083% nebulizer solution Take 2.5 mg by nebulization every 6 (six) hours as needed for wheezing or shortness of breath.     albuterol  (VENTOLIN  HFA) 108 (90 Base) MCG/ACT inhaler Inhale 1-2 puffs into the lungs every 6 (six) hours as needed for wheezing or shortness of breath.     BELSOMRA 15 MG TABS Take 1 tablet by mouth daily as needed for sleep.     budesonide-formoterol (SYMBICORT) 160-4.5 MCG/ACT inhaler Inhale 1 puff into the lungs daily as needed (respiratory issues.).     carvedilol (COREG) 6.25 MG tablet Take 6.25 mg by mouth 2 (two) times daily.     clotrimazole-betamethasone (LOTRISONE) cream Apply 1 Application topically 2 (two) times daily.     dicyclomine (BENTYL) 10 MG capsule Take 10 mg by mouth 4 (four) times daily as needed for spasms.     empagliflozin (JARDIANCE) 10 MG TABS tablet Take by mouth daily.     fluticasone  (FLONASE ) 50 MCG/ACT nasal spray Place 1 spray into both nostrils daily.     furosemide (LASIX) 40 MG tablet Take 40 mg by mouth every other day. (Patient taking differently: Take 40 mg by mouth as needed.)     lidocaine  (LIDODERM ) 5 % Place 1 patch onto the skin daily. Remove & Discard patch within 12 hours or as needed.     LINZESS  72 MCG capsule TAKE 1 CAPSULE BY MOUTH DAILY BEFORE BREAKFAST. (Patient taking differently: Take 72 mcg by mouth as needed.) 30 capsule 0   lisinopril  (ZESTRIL ) 10 MG tablet  Take 10 mg by mouth 2 (two) times daily. (Patient taking differently: Take 30 mg by mouth daily.)     omeprazole  (PRILOSEC) 40 MG capsule Take 1 capsule (40 mg total) by mouth daily. 90 capsule 3   oxyCODONE -acetaminophen  (PERCOCET) 10-325 MG tablet Take 1 tablet by mouth every 4 (four) hours as needed for pain. 50 tablet 0   pravastatin (PRAVACHOL) 40 MG tablet Take 40 mg by mouth daily.      risperiDONE (RISPERDAL M-TABS) 1 MG disintegrating tablet Take 1.5 mg by mouth 2 (two) times daily.     tamsulosin  (FLOMAX ) 0.4 MG CAPS capsule Take 1 capsule (0.4 mg total) by mouth daily. 90 capsule 3   celecoxib (CELEBREX) 50 MG capsule Take 50 mg by mouth 2 (two) times daily. (Patient not taking: Reported on 08/18/2023)     diazepam  (VALIUM ) 5 MG tablet Take 1 tablet (5 mg total) by mouth 3 (three) times daily. (Patient taking differently: Take 5 mg by mouth every 12 (twelve) hours. anxiety) 30 tablet 0   DULoxetine (CYMBALTA) 60 MG capsule Take 60 mg by mouth daily.     sildenafil  (  VIAGRA ) 100 MG tablet Take 1 tablet (100 mg total) by mouth daily as needed for erectile dysfunction. 30 tablet 11   No current facility-administered medications for this visit.    Allergies as of 08/18/2023 - Review Complete 08/18/2023  Allergen Reaction Noted   Penicillins Hives 07/21/2013   Coconut (cocos nucifera) Hives and Swelling 01/12/2021    Social History   Socioeconomic History   Marital status: Legally Separated    Spouse name: Not on file   Number of children: Not on file   Years of education: Not on file   Highest education level: Not on file  Occupational History   Not on file  Tobacco Use   Smoking status: Former    Current packs/day: 0.00    Types: Cigarettes    Quit date: 08/2020    Years since quitting: 3.0   Smokeless tobacco: Never  Vaping Use   Vaping status: Every Day  Substance and Sexual Activity   Alcohol  use: Not Currently   Drug use: No   Sexual activity: Not on file  Other  Topics Concern   Not on file  Social History Narrative   Not on file   Social Drivers of Health   Financial Resource Strain: Low Risk  (11/24/2022)   Received from [facility]   Overall Financial Resource Strain (CARDIA)    Difficulty of Paying Living Expenses: Not hard at all  Food Insecurity: No Food Insecurity (04/18/2023)   Received from [facility]   Hunger Vital Sign    Worried About Running Out of Food in the Last Year: Never true    Ran Out of Food in the Last Year: Never true  Transportation Needs: No Transportation Needs (04/18/2023)   Received from [facility] - Transportation    Lack of Transportation (Medical): No    Lack of Transportation (Non-Medical): No  Physical Activity: Insufficiently Active (12/22/2022)   Received from [facility]   Exercise Vital Sign    Days of Exercise per Week: 3 days    Minutes of Exercise per Session: 30 min  Stress: No Stress Concern Present (04/18/2023)   Received from [facility] - Occupational Stress Questionnaire    Feeling of Stress : Not at all  Recent Concern: Stress - Stress Concern Present (02/28/2023)   Received from [facility] - Occupational Stress Questionnaire    Feeling of Stress : Rather much  Social Connections: Socially Isolated (12/22/2022)   Received from [facility]   Social Connection and Isolation Panel    Frequency of Communication with Friends and Family: Three times a week    Frequency of Social Gatherings with Friends and Family: Three times a week    Attends Religious Services: Never    Active Member of Clubs or Organizations: No    Attends Engineer, structural: Never    Marital Status: Never married    Review of systems General: negative for malaise, night sweats, fever, chills, weight los Neck: Negative for lumps, goiter, pain and significant neck swelling Resp:  Negative for cough, wheezing, dyspnea at rest CV: Negative for chest pain, leg swelling, palpitations, orthopnea GI: denies melena, hematochezia, nausea, vomiting, diarrhea, dysphagia, odyonophagia, early satiety or unintentional weight loss. +constipation  MSK: Negative for joint pain or swelling, back pain, and muscle pain. Derm: Negative for itching or rash Psych: Denies depression, anxiety, memory  loss, confusion. No homicidal or suicidal ideation.  Heme: Negative for prolonged bleeding, bruising easily, and swollen nodes. Endocrine: Negative for cold or heat intolerance, polyuria, polydipsia and goiter. Neuro: negative for tremor, gait imbalance, syncope and seizures. The remainder of the review of systems is noncontributory.  Physical Exam: BP 126/82 (BP Location: Left Arm, Patient Position: Sitting, Cuff Size: Large)   Pulse 75   Temp (!) 97.1 F (36.2 C) (Temporal)   Ht 5' 9 (1.753 m)   Wt 226 lb 1.6 oz (102.6 kg)   BMI 33.39 kg/m  General:   Alert and oriented. No distress noted. Pleasant and cooperative.  Head:  Normocephalic and atraumatic. Eyes:  Conjuctiva clear without scleral icterus. Mouth:  Oral mucosa pink and moist. Good dentition. No lesions. Heart: Normal rate and rhythm, s1 and s2 heart sounds present.  Lungs: Clear lung sounds in all lobes. Respirations equal and unlabored. Abdomen:  +BS, soft, non-tender and non-distended. No rebound or guarding. No HSM or masses noted. +umbilical hernia present, soft Derm: No palmar erythema or jaundice Msk:  Symmetrical without gross deformities. Normal posture. Extremities:  Without edema. Neurologic:  Alert and  oriented x4 Psych:  Alert and cooperative. Normal mood and affect.  Invalid input(s): 6 MONTHS   ASSESSMENT: Joseph Ellison is a 68 y.o. male presenting today for abdominal pain, abnormal CT of the colon, hepatic steatosis, drug induced constipation   Abdominal pain/abnormal CT of colon: Lower  abdominal pain in February, prompting CT imaging by PCP which showed a thickened portion of sigmoid colon, query colitis vs. SCAD. Also showed evidence of constipation. He was started on bowel regimen by PCP which improved his pain. Feeling much better now. Denies rectal bleeding or melena. Last TCS in 2021. Recommend proceeding with colonoscopy for further evaluation of abnormal colon on imaging.   Drug induced constipation: has used linzess  PRN off and on, seems to do better with more regular dosing, Should continue linzess  72mcg daily for now.  Hepatic steatosis: noted on recent CT. Plt count and LFTs WNL. We discussed importance of routine exercise, healthy diet (mediterranean) and good weight management with goal of overall decrease in weight of about 7%. We discussed potential progression to NASH or cirrhosis and importance of prevention of these with the above interventions. Reassuringly FIB 4 score is 1.06 likely excluding any advanced fibrosis.    PLAN:  -schedule colonoscopy ASA III, 2 day prep due to opiates -continue linzess  72mcg, take this daily -Increase water  intake, aim for atleast 64 oz per day -Increase fruits, veggies and whole grains, kiwi and prunes are especially good for constipation -mediterranean diet -routine exercise -good weight management, decrease weight by 7%  All questions were answered, patient verbalized understanding and is in agreement with plan as outlined above.    Follow Up: TBD after colonoscopy   Jaleesa Cervi L. Adrien Alberta, MSN, APRN, AGNP-C Adult-Gerontology Nurse Practitioner [facility]  I have reviewed the note and agree with the APP's assessment as described in this progress note  Samantha Cress, MD Gastroenterology and Hepatology [facility]

## 2023-08-19 NOTE — Telephone Encounter (Signed)
 Left message to return call

## 2023-08-22 NOTE — Telephone Encounter (Signed)
 Unable to reach patient. Will send letter.

## 2023-08-23 NOTE — Telephone Encounter (Signed)
 noted

## 2023-08-24 NOTE — Telephone Encounter (Signed)
 Received VM from pt he was returning a call. Called back, LMTCB

## 2023-08-25 MED ORDER — PEG 3350-KCL-NA BICARB-NACL 420 G PO SOLR: 4000.0000 mL | Freq: Once | ORAL | 0 refills | Status: AC

## 2023-08-25 NOTE — Telephone Encounter (Addendum)
 Pt called back. He has been scheduled for 7/1. Aware will send instructions to him. Confirmed address. Rx for prep [place] [facility] [place] [place] Aware will call back with pre-op appt. Aware also to hold jardiance 3 days prior.

## 2023-08-25 NOTE — Telephone Encounter (Signed)
 LMOVM on named VM with pre-op appt details

## 2023-08-25 NOTE — Addendum Note (Signed)
 Addended by: Feliz Hosteller on: 08/25/2023 01:34 PM   Modules accepted: Orders

## 2023-08-31 NOTE — Patient Instructions (Signed)
 Joseph Ellison  08/31/2023     @PREFPERIOPPHARMACY @   Your procedure is scheduled on 09/06/2023.   Report to [facility] at 7:45 A.M.   Call this number if you have problems the morning of surgery:   [phone]  If you experience any cold or flu symptoms such as cough, fever, chills, shortness of breath, etc. between now and your scheduled surgery, please notify us  at the above number.   Remember:   Please follow the diet and prep instructions given to you by Dr Samuel office.      You may drink clear liquids until 5:45 AM .  Clear liquids allowed are:                    Water , Carbonated beverages (diabetics please choose diet or no sugar options), Clear Tea (No creamer, milk, or cream, including half & half and powdered creamer), Clear Sports drink (No red color; diabetics please choose diet or no sugar options), and Plain Popsicles Only (No red color; diabetics please choose no sugar options)    Take these medicines the morning of surgery with A SIP OF WATER  : Valium  Cymbalta Percocet Risperidone Flomax  and Prilosec.    Last dose of Jardiance should be on 09/02/23   Please use your Inhaler before coming to the hospital and bring your rescue inhaler with you!    Do not wear jewelry, make-up or nail polish, including gel polish,  artificial nails, or any other type of covering on natural nails (fingers and  toes).  Do not wear lotions, powders, or perfumes, or deodorant.  Do not shave 48 hours prior to surgery.  Men may shave face and neck.  Do not bring valuables to the hospital.  [facility] is not responsible for any belongings or valuables.  Contacts, dentures or bridgework may not be worn into surgery.  Leave your suitcase in the car.  After surgery it may be brought to your room.  For patients admitted to the hospital, discharge time will be determined by your treatment team.  Patients discharged the day of surgery will not be allowed to drive home.   Name  and phone number of your driver:   Family  Special instructions:  n/a  Please read over the following fact sheets that you were given. Care and Recovery After Surgery   Colonoscopy, Adult A colonoscopy is a procedure to look at the entire large intestine. This procedure is done using a long, thin, flexible tube that has a camera on the end. You may have a colonoscopy: As a part of normal colorectal screening. If you have certain symptoms, such as: A low number of red blood cells in your blood (anemia). Diarrhea that does not go away. Pain in your abdomen. Blood in your stool. A colonoscopy can help screen for and diagnose medical problems, including: An abnormal growth of cells or tissue (tumor). Abnormal growths within the lining of your intestine (polyps). Inflammation. Areas of bleeding. Tell your health care provider about: Any allergies you have. All medicines you are taking, including vitamins, herbs, eye drops, creams, and over-the-counter medicines. Any problems you or family members have had with anesthetic medicines. Any bleeding problems you have. Any surgeries you have had. Any medical conditions you have. Any problems you have had with having bowel movements. Whether you are pregnant or may be pregnant. What are the risks? Generally, this is a safe procedure. However, problems may occur, including: Bleeding. Damage to your  intestine. Allergic reactions to medicines given during the procedure. Infection. This is rare. What happens before the procedure? Eating and drinking restrictions Follow instructions from your health care provider about eating or drinking restrictions, which may include: A few days before the procedure: Follow a low-fiber diet. Avoid nuts, seeds, dried fruit, raw fruits, and vegetables. 1-3 days before the procedure: Eat only gelatin dessert or ice pops. Drink only clear liquids, such as water , clear juice, clear broth or bouillon, black  coffee or tea, or clear soft drinks or sports drinks. Avoid liquids that contain red or purple dye. The day of the procedure: Do not eat solid foods. You may continue to drink clear liquids until up to 2 hours before the procedure. Do not eat or drink anything starting 2 hours before the procedure, or within the time period that your health care provider recommends. Bowel prep If you were prescribed a bowel prep to take by mouth (orally) to clean out your colon: Take it as told by your health care provider. Starting the day before your procedure, you will need to drink a large amount of liquid medicine. The liquid will cause you to have many bowel movements of loose stool until your stool becomes almost clear or light green. If your skin or the opening between the buttocks (anus) gets irritated from diarrhea, you may relieve the irritation using: Wipes with medicine in them, such as adult wet wipes with aloe and vitamin E. A product to soothe skin, such as petroleum jelly. If you vomit while drinking the bowel prep: Take a break for up to 60 minutes. Begin the bowel prep again. Call your health care provider if you keep vomiting or you cannot take the bowel prep without vomiting. To clean out your colon, you may also be given: Laxative medicines. These help you have a bowel movement. Instructions for enema use. An enema is liquid medicine injected into your rectum. Medicines Ask your health care provider about: Changing or stopping your regular medicines or supplements. This is especially important if you are taking iron supplements, diabetes medicines, or blood thinners. Taking medicines such as aspirin and ibuprofen. These medicines can thin your blood. Do not take these medicines unless your health care provider tells you to take them. Taking over-the-counter medicines, vitamins, herbs, and supplements. General instructions Ask your health care provider what steps will be taken to help  prevent infection. These may include washing skin with a germ-killing soap. If you will be going home right after the procedure, plan to have a responsible adult: Take you home from the hospital or clinic. You will not be allowed to drive. Care for you for the time you are told. What happens during the procedure?  An IV will be inserted into one of your veins. You will be given a medicine to make you fall asleep (general anesthetic). You will lie on your side with your knees bent. A lubricant will be put on the tube. Then the tube will be: Inserted into your anus. Gently eased through all parts of your large intestine. Air will be sent into your colon to keep it open. This may cause some pressure or cramping. Images will be taken with the camera and will appear on a screen. A small tissue sample may be removed to be looked at under a microscope (biopsy). The tissue may be sent to a lab for testing if any signs of problems are found. If small polyps are found, they may be removed and  checked for cancer cells. When the procedure is finished, the tube will be removed. The procedure may vary among health care providers and hospitals. What happens after the procedure? Your blood pressure, heart rate, breathing rate, and blood oxygen level will be monitored until you leave the hospital or clinic. You may have a small amount of blood in your stool. You may pass gas and have mild cramping or bloating in your abdomen. This is caused by the air that was used to open your colon during the exam. If you were given a sedative during the procedure, it can affect you for several hours. Do not drive or operate machinery until your health care provider says that it is safe. It is up to you to get the results of your procedure. Ask your health care provider, or the department that is doing the procedure, when your results will be ready. Summary A colonoscopy is a procedure to look at the entire large  intestine. Follow instructions from your health care provider about eating and drinking before the procedure. If you were prescribed an oral bowel prep to clean out your colon, take it as told by your health care provider. During the colonoscopy, a flexible tube with a camera on its end is inserted into the anus and then passed into all parts of the large intestine. This information is not intended to replace advice given to you by your health care provider. Make sure you discuss any questions you have with your health care provider. Document Revised: 04/06/2022 Document Reviewed: 10/15/2020 Elsevier Patient Education  2024 [employer]    Monitored Anesthesia Care Anesthesia refers to the techniques, procedures, and medicines that help a person stay safe and comfortable during surgery. Monitored anesthesia care, or sedation, is one type of anesthesia. You may have sedation if you do not need to be asleep for your procedure. Procedures that use sedation may include: Surgery to remove cataracts from your eyes. A dental procedure. A biopsy. This is when a tissue sample is removed and looked at under a microscope. You will be watched closely during your procedure. Your level of sedation or type of anesthesia may be changed to fit your needs. Tell a health care provider about: Any allergies you have. All medicines you are taking, including vitamins, herbs, eye drops, creams, and over-the-counter medicines. Any problems you or family members have had with anesthesia. Any bleeding problems you have. Any surgeries you have had. Any medical conditions or illnesses you have. This includes sleep apnea, cough, fever, or the flu. Whether you are pregnant or may be pregnant. Whether you use cigarettes, alcohol , or drugs. Any use of steroids, whether by mouth or as a cream. What are the risks? Your health care provider will talk with you about risks. These may include: Getting too much medicine  (oversedation). Nausea. Allergic reactions to medicines. Trouble breathing. If this happens, a breathing tube may be used to help you breathe. It will be removed when you are awake and breathing on your own. Heart trouble. Lung trouble. Confusion that gets better with time (emergence delirium). What happens before the procedure? When to stop eating and drinking Follow instructions from your health care provider about what you may eat and drink. These may include: 8 hours before your procedure Stop eating most foods. Do not eat meat, fried foods, or fatty foods. Eat only light foods, such as toast or crackers. All liquids are okay except energy drinks and alcohol . 6 hours before your procedure Stop  eating. Drink only clear liquids, such as water , clear fruit juice, black coffee, plain tea, and sports drinks. Do not drink energy drinks or alcohol . 2 hours before your procedure Stop drinking all liquids. You may be allowed to take medicines with small sips of water . If you do not follow your health care provider's instructions, your procedure may be delayed or canceled. Medicines Ask your health care provider about: Changing or stopping your regular medicines. These include any diabetes medicines or blood thinners you take. Taking medicines such as aspirin and ibuprofen. These medicines can thin your blood. Do not take them unless your health care provider tells you to. Taking over-the-counter medicines, vitamins, herbs, and supplements. Testing You may have an exam or testing. You may have a blood or urine sample taken. General instructions Do not use any products that contain nicotine or tobacco for at least 4 weeks before the procedure. These products include cigarettes, chewing tobacco, and vaping devices, such as e-cigarettes. If you need help quitting, ask your health care provider. If you will be going home right after the procedure, plan to have a responsible adult: Take you  home from the hospital or clinic. You will not be allowed to drive. Care for you for the time you are told. What happens during the procedure?  Your blood pressure, heart rate, breathing, level of pain, and blood oxygen level will be monitored. An IV will be inserted into one of your veins. You may be given: A sedative. This helps you relax. Anesthesia. This will: Numb certain areas of your body. Make you fall asleep for surgery. You will be given medicines as needed to keep you comfortable. The more medicine you are given, the deeper your level of sedation will be. Your level of sedation may be changed to fit your needs. There are three levels of sedation: Mild sedation. At this level, you may feel awake and relaxed. You will be able to follow directions. Moderate sedation. At this level, you will be sleepy. You may not remember the procedure. Deep sedation. At this level, you will be asleep. You will not remember the procedure. How you get the medicines will depend on your age and the procedure. They may be given as: A pill. This may be taken by mouth (orally) or inserted into the rectum. An injection. This may be into a vein or muscle. A spray through the nose. After your procedure is over, the medicine will be stopped. The procedure may vary among health care providers and hospitals. What happens after the procedure? Your blood pressure, heart rate, breathing rate, and blood oxygen level will be monitored until you leave the hospital or clinic. You may feel sleepy, clumsy, or nauseous. You may not remember what happened during or after the procedure. Sedation can affect you for several hours. Do not drive or use machinery until your health care provider says that it is safe. This information is not intended to replace advice given to you by your health care provider. Make sure you discuss any questions you have with your health care provider. Document Revised: 07/20/2021 Document  Reviewed: 07/20/2021 Elsevier Patient Education  2024 [employer].

## 2023-09-01 ENCOUNTER — Encounter (HOSPITAL_COMMUNITY)
Admission: RE | Admit: 2023-09-01 | Discharge: 2023-09-01 | Disposition: A | Discharge status: Home / Self Care | Source: Ambulatory Visit | Attending: Gastroenterology | Admitting: Gastroenterology

## 2023-09-01 ENCOUNTER — Encounter (HOSPITAL_COMMUNITY): Payer: Self-pay

## 2023-09-01 ENCOUNTER — Telehealth (INDEPENDENT_AMBULATORY_CARE_PROVIDER_SITE_OTHER): Payer: Self-pay | Admitting: Gastroenterology

## 2023-09-01 ENCOUNTER — Other Ambulatory Visit: Payer: Self-pay

## 2023-09-01 VITALS — BP 102/65 | HR 68 | Resp 18 | Ht 69.0 in | Wt 226.1 lb

## 2023-09-01 DIAGNOSIS — T502X5A Adverse effect of carbonic-anhydrase inhibitors, benzothiadiazides and other diuretics, initial encounter: Secondary | ICD-10-CM | POA: Insufficient documentation

## 2023-09-01 DIAGNOSIS — Z01818 Encounter for other preprocedural examination: Secondary | ICD-10-CM | POA: Diagnosis not present

## 2023-09-01 DIAGNOSIS — I10 Essential (primary) hypertension: Secondary | ICD-10-CM | POA: Insufficient documentation

## 2023-09-01 DIAGNOSIS — Z01812 Encounter for preprocedural laboratory examination: Secondary | ICD-10-CM | POA: Diagnosis present

## 2023-09-01 DIAGNOSIS — Z01810 Encounter for preprocedural cardiovascular examination: Secondary | ICD-10-CM | POA: Diagnosis present

## 2023-09-01 LAB — BASIC METABOLIC PANEL WITH GFR
Anion gap: 11 (ref 5–15)
BUN: 15 mg/dL (ref 8–23)
CO2: 28 mmol/L (ref 22–32)
Calcium: 9.1 mg/dL (ref 8.9–10.3)
Chloride: 99 mmol/L (ref 98–111)
Creatinine, Ser: 0.78 mg/dL (ref 0.61–1.24)
GFR, Estimated: >60 mL/min (ref >60)
Glucose, Bld: 110 mg/dL — ABNORMAL HIGH (ref 70–99)
Potassium: 3.9 mmol/L (ref 3.5–5.1)
Sodium: 138 mmol/L (ref 135–145)

## 2023-09-01 NOTE — Telephone Encounter (Signed)
 Pt left voicemail that he received a text message stating he needed to be somewhere at 1:15 tomorrow (message was left late yesterday evening) but he didn't remember having any appointment. Pt stated he is not sure if he is suppose come to the office or what Returned call to pt but had to leave message on voicemail. Left message with location of appt and time. Also left callback number to office

## 2023-09-06 ENCOUNTER — Encounter (HOSPITAL_COMMUNITY)
Admission: RE | Disposition: A | Discharge status: Home / Self Care | Payer: Self-pay | Source: Home / Self Care | Attending: Gastroenterology

## 2023-09-06 ENCOUNTER — Ambulatory Visit (HOSPITAL_COMMUNITY)

## 2023-09-06 ENCOUNTER — Ambulatory Visit (HOSPITAL_COMMUNITY)
Admission: RE | Admit: 2023-09-06 | Discharge: 2023-09-06 | Disposition: A | Discharge status: Home / Self Care | Attending: Gastroenterology | Admitting: Gastroenterology

## 2023-09-06 ENCOUNTER — Encounter (HOSPITAL_COMMUNITY): Payer: Self-pay | Admitting: Gastroenterology

## 2023-09-06 DIAGNOSIS — D123 Benign neoplasm of transverse colon: Secondary | ICD-10-CM | POA: Insufficient documentation

## 2023-09-06 DIAGNOSIS — K219 Gastro-esophageal reflux disease without esophagitis: Secondary | ICD-10-CM | POA: Insufficient documentation

## 2023-09-06 DIAGNOSIS — Z87891 Personal history of nicotine dependence: Secondary | ICD-10-CM | POA: Insufficient documentation

## 2023-09-06 DIAGNOSIS — K648 Other hemorrhoids: Secondary | ICD-10-CM | POA: Diagnosis not present

## 2023-09-06 DIAGNOSIS — K2950 Unspecified chronic gastritis without bleeding: Secondary | ICD-10-CM | POA: Diagnosis not present

## 2023-09-06 DIAGNOSIS — K5730 Diverticulosis of large intestine without perforation or abscess without bleeding: Secondary | ICD-10-CM | POA: Diagnosis not present

## 2023-09-06 DIAGNOSIS — J449 Chronic obstructive pulmonary disease, unspecified: Secondary | ICD-10-CM | POA: Diagnosis not present

## 2023-09-06 DIAGNOSIS — R933 Abnormal findings on diagnostic imaging of other parts of digestive tract: Secondary | ICD-10-CM | POA: Insufficient documentation

## 2023-09-06 DIAGNOSIS — I10 Essential (primary) hypertension: Secondary | ICD-10-CM | POA: Diagnosis not present

## 2023-09-06 HISTORY — DX: Prediabetes: R73.03

## 2023-09-06 HISTORY — PX: COLONOSCOPY: SHX5424

## 2023-09-06 LAB — GLUCOSE, CAPILLARY: Glucose-Capillary: 94 mg/dL (ref 70–99)

## 2023-09-06 SURGERY — COLONOSCOPY
Anesthesia: General

## 2023-09-06 MED ORDER — DEXMEDETOMIDINE HCL IN NACL 80 MCG/20ML IV SOLN
INTRAVENOUS | Status: DC | PRN
  Administered 2023-09-06: 16 ug via INTRAVENOUS

## 2023-09-06 MED ORDER — PROPOFOL 10 MG/ML IV BOLUS
INTRAVENOUS | Status: DC | PRN
  Administered 2023-09-06: 100 mg via INTRAVENOUS

## 2023-09-06 MED ORDER — EPHEDRINE SULFATE-NACL 50-0.9 MG/10ML-% IV SOSY
PREFILLED_SYRINGE | INTRAVENOUS | Status: DC | PRN
Start: 2023-09-06 — End: 2023-09-06
  Administered 2023-09-06 (×2): 10 mg via INTRAVENOUS

## 2023-09-06 MED ORDER — PROPOFOL 500 MG/50ML IV EMUL
INTRAVENOUS | Status: DC | PRN
  Administered 2023-09-06: 150 ug/kg/min via INTRAVENOUS

## 2023-09-06 MED ORDER — LACTATED RINGERS IV SOLN: INTRAVENOUS | Status: DC

## 2023-09-06 NOTE — Anesthesia Preprocedure Evaluation (Addendum)
 Anesthesia Evaluation  Patient identified by MRN, date of birth, ID band Patient awake    Reviewed: Allergy & Precautions, H&P , NPO status , Patient's Chart, lab work & pertinent test results  Airway Mallampati: III  TM Distance: >3 FB Neck ROM: Full    Dental no notable dental hx.    Pulmonary former smoker   Pulmonary exam normal breath sounds clear to auscultation       Cardiovascular hypertension, Normal cardiovascular exam Rhythm:Regular Rate:Normal     Neuro/Psych negative neurological ROS  negative psych ROS   GI/Hepatic negative GI ROS, Neg liver ROS,,,  Endo/Other  negative endocrine ROS    Renal/GU negative Renal ROS  negative genitourinary   Musculoskeletal negative musculoskeletal ROS (+)    Abdominal   Peds negative pediatric ROS (+)  Hematology negative hematology ROS (+)   Anesthesia Other Findings   Reproductive/Obstetrics negative OB ROS                             Anesthesia Physical Anesthesia Plan  ASA: 3  Anesthesia Plan: General   Post-op Pain Management:    Induction: Intravenous  PONV Risk Score and Plan: Propofol  infusion  Airway Management Planned: Nasal Cannula  Additional Equipment:   Intra-op Plan:   Post-operative Plan:   Informed Consent: I have reviewed the patients History and Physical, chart, labs and discussed the procedure including the risks, benefits and alternatives for the proposed anesthesia with the patient or authorized representative who has indicated his/her understanding and acceptance.     Dental advisory given  Plan Discussed with: CRNA  Anesthesia Plan Comments:        Anesthesia Quick Evaluation

## 2023-09-06 NOTE — Transfer of Care (Signed)
 Immediate Anesthesia Transfer of Care Note  Patient: Joseph Ellison  Procedure(s) Performed: COLONOSCOPY  Patient Location: Endoscopy Unit  Anesthesia Type:General  Level of Consciousness: awake, alert , oriented, and patient cooperative  Airway & Oxygen Therapy: Patient Spontanous Breathing  Post-op Assessment: Report given to RN, Post -op Vital signs reviewed and stable, and Patient moving all extremities X 4  Post vital signs: Reviewed and stable  Last Vitals:  Vitals Value Taken Time  BP 88/62 09/06/23 09:47  Temp 36.4 C 09/06/23 09:47  Pulse 58 09/06/23 09:47  Resp 21 09/06/23 09:47  SpO2 95 % 09/06/23 09:47    Last Pain:  Vitals:   09/06/23 0947  TempSrc: Oral  PainSc: 0-No pain      Patients Stated Pain Goal: 6 (09/06/23 0835)  Complications: No notable events documented.

## 2023-09-06 NOTE — Op Note (Signed)
 [employer] Patient Name: Joseph Ellison Procedure Date: 09/06/2023 9:15 AM MRN: [id number] Date of Birth: 1955/12/26 Attending MD: Toribio Fortune , , [id number] CSN: [id number] Age: 68 Admit Type: Outpatient Procedure:                Colonoscopy Indications:              Abnormal CT of the GI tract Providers:                Toribio Fortune, Rosina Sprague, Devere Lodge, Jon Loge Referring MD:              Medicines:                Monitored Anesthesia Care Complications:            No immediate complications. Estimated Blood Loss:     Estimated blood loss: none. Procedure:                Pre-Anesthesia Assessment:                           - Prior to the procedure, a History and Physical                            was performed, and patient medications, allergies                            and sensitivities were reviewed. The patient's                            tolerance of previous anesthesia was reviewed.                           - The risks and benefits of the procedure and the                            sedation options and risks were discussed with the                            patient. All questions were answered and informed                            consent was obtained.                           - ASA Grade Assessment: II - A patient with mild                            systemic disease.                           After obtaining informed consent, the colonoscope                            was passed under direct vision. Throughout the  procedure, the patient's blood pressure, pulse, and                            oxygen saturations were monitored continuously. The                            [id number]-HQ190L ([id number]) scope was introduced through                            the anus and advanced to the the cecum, identified                            by appendiceal orifice and ileocecal valve. The                             colonoscopy was performed without difficulty. The                            patient tolerated the procedure well. The quality                            of the bowel preparation was adequate. Scope In: 9:27:36 AM Scope Out: 9:43:54 AM Scope Withdrawal Time: 0 hours 11 minutes 25 seconds  Total Procedure Duration: 0 hours 16 minutes 18 seconds  Findings:      The perianal and digital rectal examinations were normal.      A 3 mm polyp was found in the transverse colon. The polyp was sessile.       The polyp was removed with a cold snare. Resection and retrieval were       complete.      Scattered small-mouthed diverticula were found in the sigmoid colon.      Non-bleeding internal hemorrhoids were found during retroflexion. The       hemorrhoids were small. Impression:               - One 3 mm polyp in the transverse colon, removed                            with a cold snare. Resected and retrieved.                           - Diverticulosis in the sigmoid colon.                           - Non-bleeding internal hemorrhoids. Moderate Sedation:      Per Anesthesia Care Recommendation:           - Discharge patient to home (ambulatory).                           - Resume previous diet.                           - Await pathology results.                           -  Repeat colonoscopy date to be determined after                            pending pathology results are reviewed for                            surveillance. Procedure Code(s):        --- Professional ---                           [phone], Colonoscopy, flexible; with removal of                            tumor(s), polyp(s), or other lesion(s) by snare                            technique Diagnosis Code(s):        --- Professional ---                           D12.3, Benign neoplasm of transverse colon (hepatic                            flexure or splenic flexure)                           K64.8, Other  hemorrhoids                           K57.30, Diverticulosis of large intestine without                            perforation or abscess without bleeding                           R93.3, Abnormal findings on diagnostic imaging of                            other parts of digestive tract CPT copyright 2022 American Medical Association. All rights reserved. The codes documented in this report are preliminary and upon coder review may  be revised to meet current compliance requirements. Toribio Fortune, MD Toribio Fortune,  09/06/2023 9:50:56 AM This report has been signed electronically. Number of Addenda: 0

## 2023-09-06 NOTE — Anesthesia Postprocedure Evaluation (Signed)
 Anesthesia Post Note  Patient: Joseph Ellison  Procedure(s) Performed: COLONOSCOPY  Patient location during evaluation: PACU Anesthesia Type: General Level of consciousness: awake and alert Pain management: pain level controlled Vital Signs Assessment: post-procedure vital signs reviewed and stable Respiratory status: spontaneous breathing, nonlabored ventilation, respiratory function stable and patient connected to nasal cannula oxygen Cardiovascular status: stable and blood pressure returned to baseline Postop Assessment: no apparent nausea or vomiting Anesthetic complications: no  No notable events documented.   Last Vitals:  Vitals:   09/06/23 0835 09/06/23 0947  BP: 102/65 (!) 88/62  Pulse: (!) 56 (!) 58  Resp: 18 (!) 21  Temp: 36.8 C 36.4 C  SpO2: 93% 95%    Last Pain:  Vitals:   09/06/23 0947  TempSrc: Oral  PainSc: 0-No pain                 Andrea Limes

## 2023-09-06 NOTE — Interval H&P Note (Signed)
 History and Physical Interval Note:  09/06/2023 8:39 AM  Joseph Ellison  has presented today for surgery, with the diagnosis of abnormal colon on CT.  The various methods of treatment have been discussed with the patient and family. After consideration of risks, benefits and other options for treatment, the patient has consented to  Procedure(s) with comments: COLONOSCOPY (N/A) - 10:00am, asa 3 as a surgical intervention.  The patient's history has been reviewed, patient examined, no change in status, stable for surgery.  I have reviewed the patient's chart and labs.  Questions were answered to the patient's satisfaction.     Dreana Britz Castaneda Mayorga

## 2023-09-06 NOTE — Discharge Instructions (Signed)
 You are being discharged to home.  Resume your previous diet.  We are waiting for your pathology results.  Your physician has recommended a repeat colonoscopy (date to be determined after pending pathology results are reviewed) for surveillance.

## 2023-09-07 ENCOUNTER — Encounter (HOSPITAL_COMMUNITY): Payer: Self-pay | Admitting: Gastroenterology

## 2023-09-07 LAB — SURGICAL PATHOLOGY

## 2023-09-08 ENCOUNTER — Ambulatory Visit (INDEPENDENT_AMBULATORY_CARE_PROVIDER_SITE_OTHER): Payer: Self-pay | Admitting: Gastroenterology

## 2023-09-08 NOTE — Progress Notes (Signed)
 7 yr TCS noted in recall Patient result letter mailed procedure note and pathology result faxed to PCP

## 2023-12-21 ENCOUNTER — Encounter (INDEPENDENT_AMBULATORY_CARE_PROVIDER_SITE_OTHER): Payer: Self-pay | Admitting: Gastroenterology
